# Patient Record
Sex: Male | Born: 1945 | Race: White | Hispanic: No | Marital: Single | Smoking: Never smoker
Health system: Southern US, Community
[De-identification: ages and names within clinical notes are randomized; demographics above are authoritative.]

---

## 2003-12-18 ENCOUNTER — Ambulatory Visit: Payer: Self-pay | Admitting: Internal Medicine

## 2009-12-10 ENCOUNTER — Ambulatory Visit: Payer: Self-pay | Admitting: Internal Medicine

## 2020-03-03 ENCOUNTER — Other Ambulatory Visit: Payer: Self-pay | Admitting: Nephrology

## 2020-03-03 DIAGNOSIS — R82998 Other abnormal findings in urine: Secondary | ICD-10-CM

## 2020-03-03 DIAGNOSIS — N1831 Chronic kidney disease, stage 3a: Secondary | ICD-10-CM

## 2020-03-15 ENCOUNTER — Other Ambulatory Visit: Payer: Self-pay

## 2020-03-15 ENCOUNTER — Ambulatory Visit
Admission: RE | Admit: 2020-03-15 | Discharge: 2020-03-15 | Disposition: A | Discharge status: Home / Self Care | Payer: Medicare Other | Source: Ambulatory Visit | Attending: Nephrology | Admitting: Nephrology

## 2020-03-15 DIAGNOSIS — N1831 Chronic kidney disease, stage 3a: Secondary | ICD-10-CM | POA: Insufficient documentation

## 2020-03-15 DIAGNOSIS — R82998 Other abnormal findings in urine: Secondary | ICD-10-CM | POA: Insufficient documentation

## 2021-04-20 IMAGING — US US RENAL
1 series · 14 of 25 positions shown · non-contrast
Comparison: 06/17/2003 report and prior.

CLINICAL DATA: Other abnormal findings in urine

EXAM:
RENAL / URINARY TRACT ULTRASOUND COMPLETE

[Series 1: us renal · 0.25mm/px · 14 of 62 slices shown]
[im 1/62]
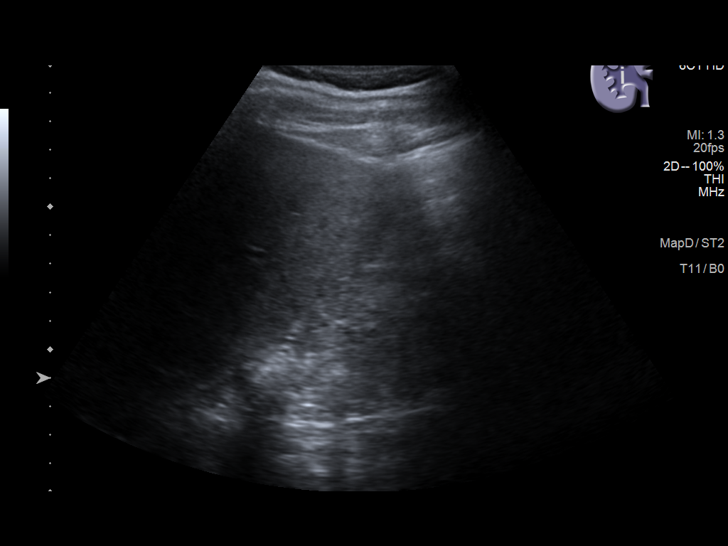
[im 6/62]
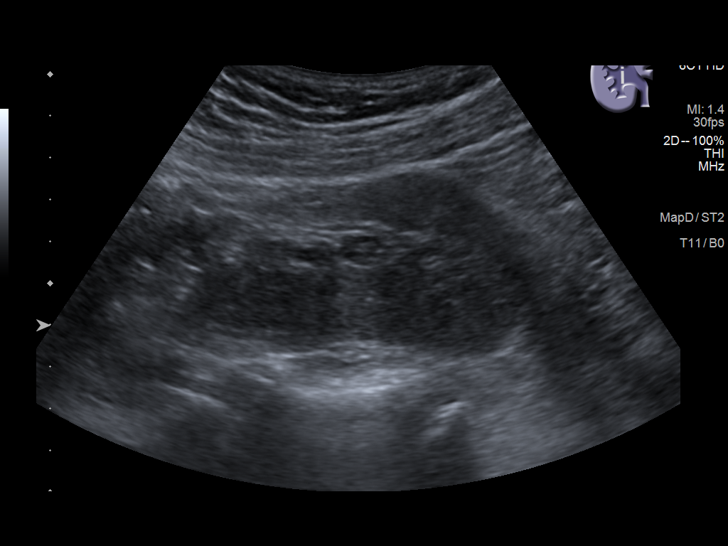
[im 11/62]
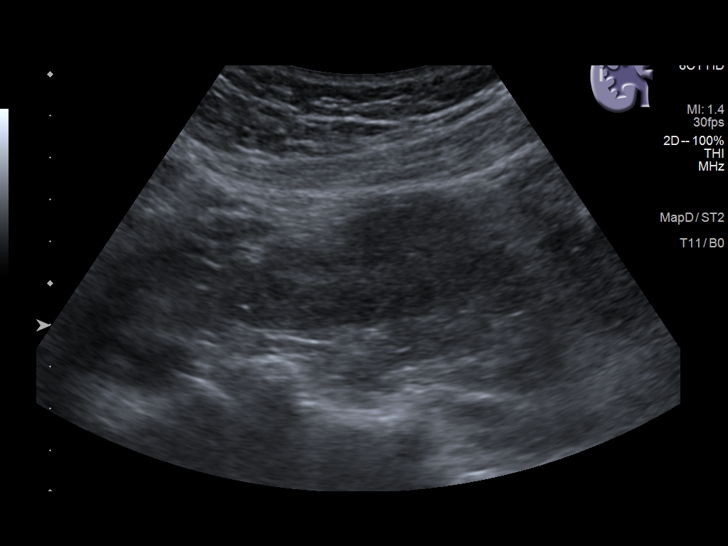
[im 16/62]
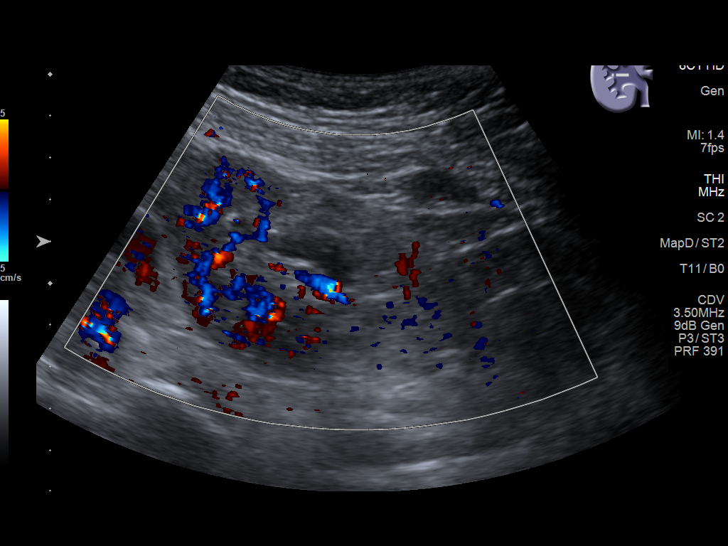
[im 21/62]
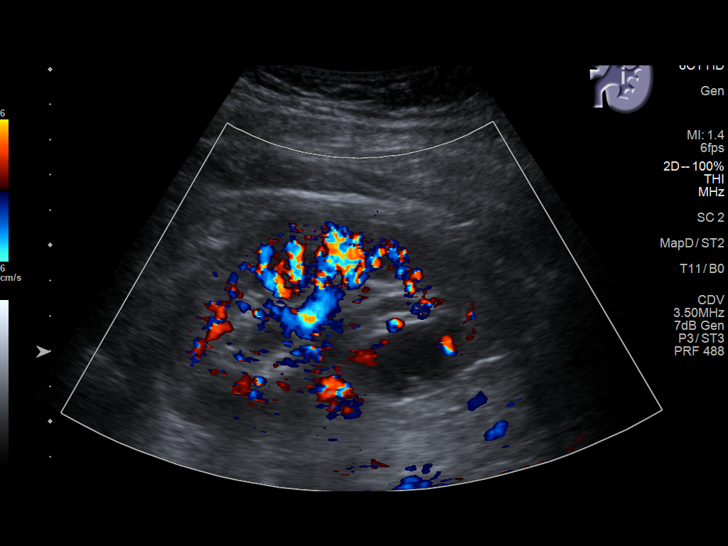
[im 23/62]
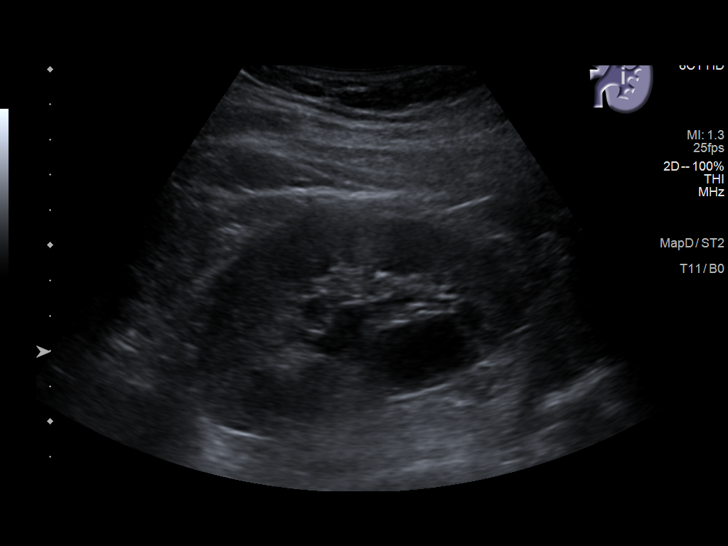
[im 28/62]
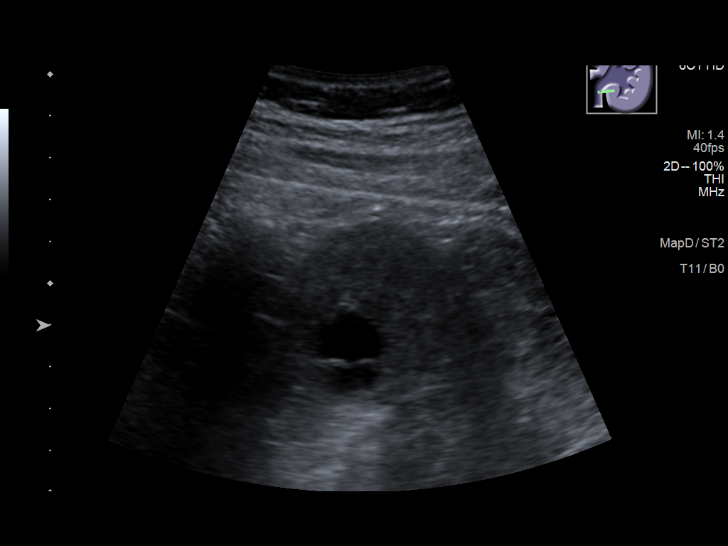
[im 34/62]
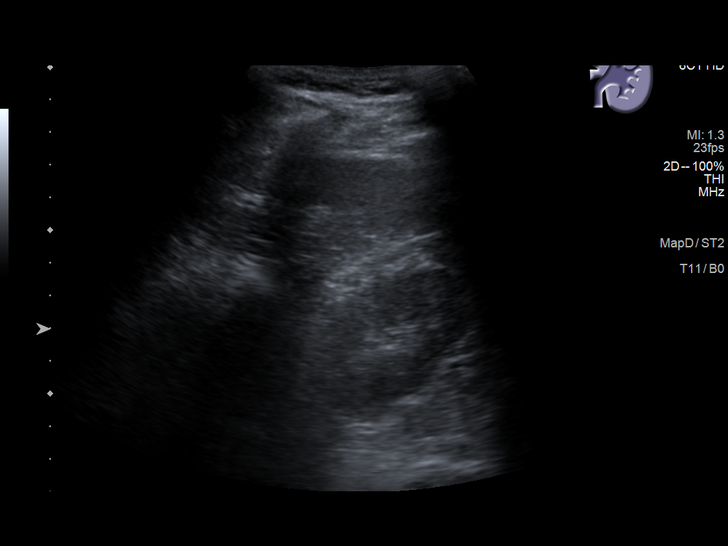
[im 39/62]
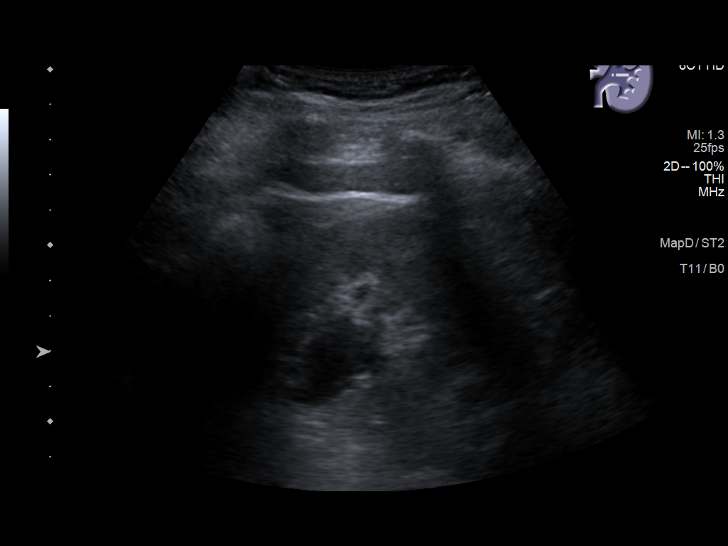
[im 41/62]
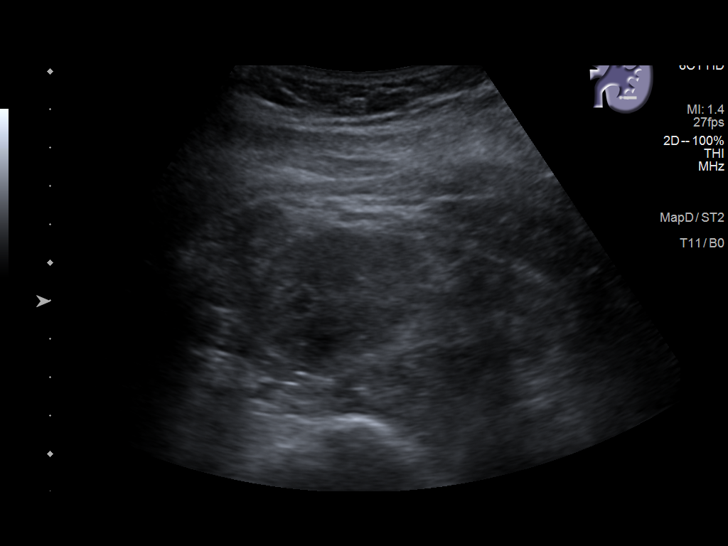
[im 46/62]
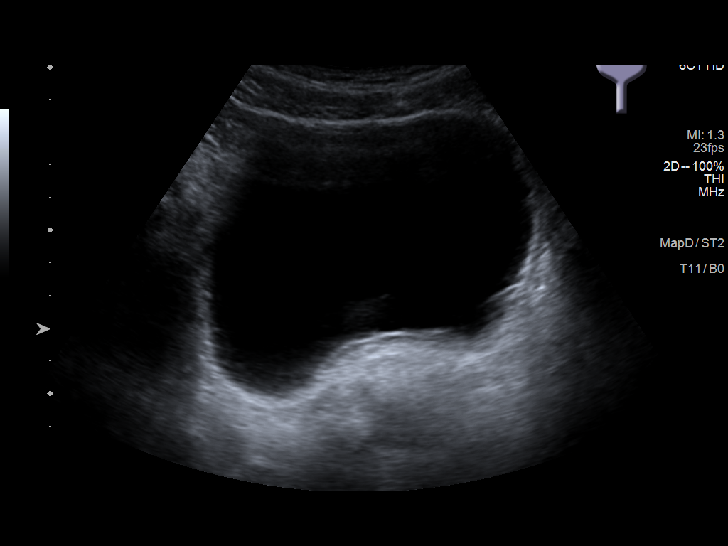
[im 51/62]
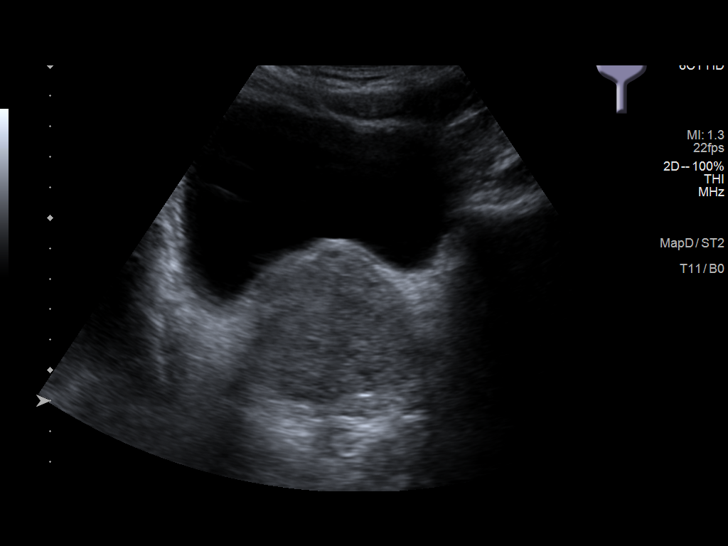
[im 56/62]
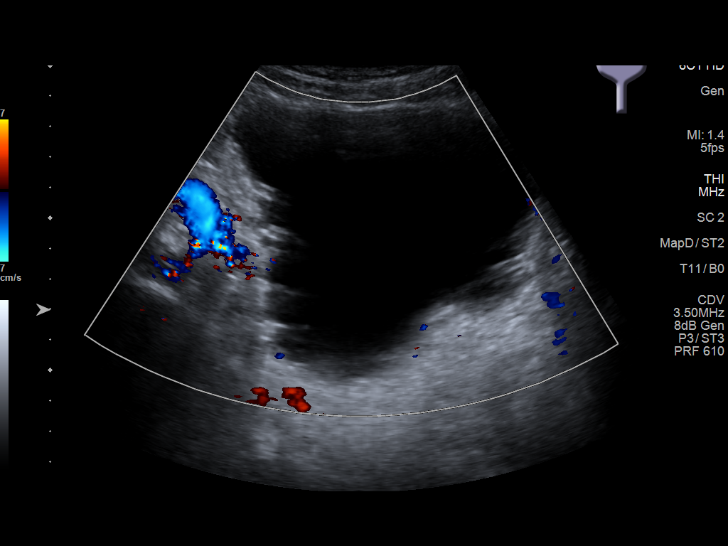
[im 62/62]
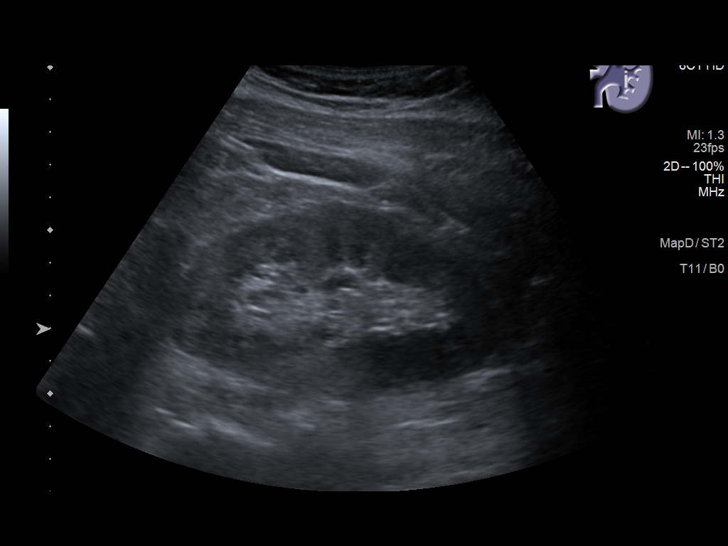

[14 of 25 positions shown; findings below may reference images not displayed]

FINDINGS: Right Kidney:

Renal measurements: 8.7 x 4.2 x 4.3 cm = volume: 82.8 mL. Pelvic
positioning. Echogenicity within normal limits. No mass or
hydronephrosis visualized.

Left Kidney:

Renal measurements: 9.5 x 4.7 x 4.9 cm = volume: 113.0 mL.
Echogenicity within normal limits. No hydronephrosis visualized.
Lower pole 2.7 x 2.0 x 2.0 cm complex cyst.

Bladder:

Appears normal for degree of bladder distention.

Other:

Prostate measures 4.6 x 5.7 x 5.3 cm with a volume of 73 mL.
IMPRESSION: Pelvic right kidney. 2.7 cm left inferior pole cyst.

Prostatomegaly.

## 2021-12-08 ENCOUNTER — Other Ambulatory Visit: Payer: Self-pay | Admitting: Orthopedic Surgery

## 2021-12-08 DIAGNOSIS — M4807 Spinal stenosis, lumbosacral region: Secondary | ICD-10-CM

## 2021-12-15 ENCOUNTER — Ambulatory Visit
Admission: RE | Admit: 2021-12-15 | Discharge: 2021-12-15 | Disposition: A | Discharge status: Home / Self Care | Payer: Medicare Other | Source: Ambulatory Visit | Attending: Orthopedic Surgery | Admitting: Orthopedic Surgery

## 2021-12-15 DIAGNOSIS — M4807 Spinal stenosis, lumbosacral region: Secondary | ICD-10-CM | POA: Diagnosis not present

## 2022-01-24 ENCOUNTER — Ambulatory Visit: Payer: Medicare Other | Admitting: Physical Therapy

## 2022-01-25 ENCOUNTER — Encounter: Payer: Self-pay | Admitting: Physical Therapy

## 2022-01-25 ENCOUNTER — Ambulatory Visit: Payer: Medicare Other | Attending: Family Medicine | Admitting: Physical Therapy

## 2022-01-25 DIAGNOSIS — M5459 Other low back pain: Secondary | ICD-10-CM | POA: Insufficient documentation

## 2022-01-25 NOTE — Therapy (Signed)
OUTPATIENT PHYSICAL THERAPY THORACOLUMBAR EVALUATION   Patient Name: Scott Ochoa MRN: [id number] DOB:[birth year], 76 y.o., male Today's Date: 01/25/2022   PT End of Session - 01/25/22 1145     Visit Number 1    Number of Visits 17    Date for PT Re-Evaluation 03/23/22    Authorization - Visit Number 1    Authorization - Number of Visits 10    Progress Note Due on Visit 10    PT Start Time 0915    PT Stop Time 1000    PT Time Calculation (min) 45 min    Activity Tolerance Patient tolerated treatment well    Behavior During Therapy [facility] for tasks assessed/performed             History reviewed. No pertinent past medical history. History reviewed. No pertinent surgical history. There are no problems to display for this patient.   PCP: Marcelino Duster MD  REFERRING PROVIDER: Burman Freestone, FNP  REFERRING DIAG: Lumbar stenosis  Rationale for Evaluation and Treatment: Rehabilitation  THERAPY DIAG:  Other low back pain - Plan: PT plan of care cert/re-cert  ONSET DATE: 2011 - new onset June 2023  SUBJECTIVE:                                                                                                                                                                                           SUBJECTIVE STATEMENT: Acute on chronic LBP  PERTINENT HISTORY:  Pt is a 76 year male presenting with chronic LBP over the past 10 years, with most recent episode beginning June of this year. Reports prior to this episode he had not had an episode of pain for 3 years, and that usually they only last a couple weeks. Insidious onset of pain. Pain is at the R side of the low back that radiates down posterior R leg with numbess and tingling to the toes. Reports pain is exacerbated by hitting golf balls, bending forward, walking >61miles, sitting/traveling >21mins, lifting something from the floor. Pt reports current pain 4/10, worst: 6/10, best: 2/10. Pt is retired, is the landlord of a couple  Manufacturing engineer, enjoys golfing and going to the gym 5-6x/week and is using strengthening machines with treadmill/elliptical for . Pt denies N/V, B&B changes, unexplained weight fluctuation, saddle paresthesia, fever, night sweats, or unrelenting night pain at this time.   PAIN:  Are you having pain? Yes: NPRS scale: 4/10 Pain location: R side of the low back radiation down RLE Pain description: sharp, n/t down RLE Aggravating factors: hitting golf balls, bending forward, walking >85miles, sitting/traveling >39mins, lifting something from the floor Relieving factors:  ice, chiropractic  PRECAUTIONS: None  WEIGHT BEARING RESTRICTIONS: No  FALLS:  Has patient fallen in last 6 months? No  LIVING ENVIRONMENT: Lives with: lives alone Lives in: House/apartment Stairs: No Has following equipment at home: None  OCCUPATION:    PLOF: Independent  PATIENT GOALS: Decrease pain to be able to play golf   NEXT MD VISIT:   OBJECTIVE:   DIAGNOSTIC FINDINGS:  Sept 2023 1. L2-3: Bulging of the disc. Facet and ligamentous hypertrophy.  Stenosis of both lateral recesses that could cause neural  compression. Findings have worsened slightly since 2011.  2. L3-4: Annular bulging with annular fissures. Facet and  ligamentous hypertrophy. Stenosis of both lateral recesses that  could cause neural compression. Findings have worsened since 2011.  3. L4-5: Annular bulging. Facet and ligamentous hypertrophy. Mild  stenosis of both lateral recesses, slightly worsened since 2011.   PATIENT SURVEYS:  FOTO goal of   SCREENING FOR RED FLAGS: Bowel or bladder incontinence: No Spinal tumors: No Cauda equina syndrome: No Compression fracture: No Abdominal aneurysm: No  COGNITION: Overall cognitive status: Within functional limits for tasks assessed     SENSATION: WFL  MUSCLE LENGTH: Hamstrings: shortened bilat approx 110d Thomas test: WNL bilat  POSTURE: rounded shoulders, forward  head, decreased lumbar lordosis, increased thoracic kyphosis, and weight shift left with R lateral trunk lean  PALPATION: Concordant hip pain to deep palpation of glute max over piriformis, reporting some radicular pain with this No TTP or pain to lumbar parspinal, QL or glute med/superior glute No change in pain with CPA/UPA mobilizations over lumbar segments, hypomobile throughout Hypomobility to mid thoracic spine as well  LUMBAR ROM:   AROM eval  Flexion 25% limited  Extension 25% limited w. pain  Right lateral flexion WNL with pain to side of the LB  Left lateral flexion wnl  Right rotation WNL  Left rotation WNL   (Blank rows = not tested)  LOWER EXTREMITY ROM:     Active  Right eval Left eval  Hip flexion WNL WNL  Hip extension 10% Limited bilat  Hip abduction WNL WNL  Hip adduction    Hip internal rotation 25% limited with hip pain bilat  Hip external rotation WNL WNL  Knee flexion WNL WNL  Knee extension WNL with tension/difficulty with true TKE  Ankle dorsiflexion WNL WNL  Ankle plantarflexion    Ankle inversion    Ankle eversion     (Blank rows = not tested)  LOWER EXTREMITY MMT:    MMT Right eval Left eval  Hip flexion 5 5  Hip extension 4 5  Hip abduction 4 5  Hip adduction    Hip internal rotation 5 5  Hip external rotation 5 5  Knee flexion    Knee extension    Ankle dorsiflexion    Ankle plantarflexion    Ankle inversion    Ankle eversion     (Blank rows = not tested)  LUMBAR SPECIAL TESTS:  Straight leg raise test: Negative, Slump test: Negative, Single leg stance test: R 20sec L ceased at , FABER test: Negative, Trendelenburg sign: Positive, and Thomas test: Negative  Repeated motion neg for lumbar forward flex and ext   FUNCTIONAL TESTS:  5 times sit to stand: 8sec 10 meter walk test: 1.59m/s SLS R 5/20sec L ceased at SL squat unable  GAIT: Distance walked: 19m Assistive device utilized: None Level of assistance:  Complete Independence Comments: R trendelenberg with decreased R shoulder height/lateral trunk lean throughout ambilation  TODAY'S TREATMENT:                                                                                                                              DATE: 01/25/22 Ther-Ex  PT reviewed the following HEP with patient with patient able to demonstrate a set of the following with min cuing for correction needed. PT educated patient on parameters of therex (how/when to inc/decrease intensity, frequency, rep/set range, stretch hold time, and purpose of therex) with verbalized understanding.   Access Code: [email or web address] - Seated Piriformis Stretch  - 1 x daily - 7 x weekly - 3 sets - 10 reps - Bridge with Hip Abduction and Resistance  - 4 x weekly - 3 sets - 12-20 reps - Single Leg Stance  - 1-2 x daily - 7 x weekly - 60sec hold  Pt with difficulty firing hip ext with initial SL bridge attempt and double leg bridge attempt unable to provoke hip ext activation for patient- good sensation with bridge with band  PATIENT EDUCATION:  Education details: Patient was educated on diagnosis, anatomy and pathology involved, prognosis, role of PT, and was given an HEP, demonstrating exercise with proper form following verbal and tactile cues, and was given a paper hand out to continue exercise at home. Pt was educated on and agreed to plan of care.  Person educated: Patient Education method: Explanation, Demonstration, Verbal cues, and Handouts Education comprehension: verbalized understanding, returned demonstration, verbal cues required, and tactile cues required  HOME EXERCISE PROGRAM: [email or web address]  ASSESSMENT:  CLINICAL IMPRESSION: Patient is a 76 y.o. male who was seen today for physical therapy evaluation and treatment for low back pain. Pt with signs and symptoms of lumbar stenosis > radiculopathy, with possible cause of radicular symptoms at level of piriformis. Impairments of decreased hip  ext and abd strength, decreased lumbar mobility, decreased hip IR bilat, gait abnormalities, posture abnormality, increased hamstring tension bilat, and pain. Activity limitations in functional squat, ambulation, prolonged sitting, listing, and forward bending; inhibiting full participation in functional ADLs and community activity, including golfing and his gym regimen. Would benefit from skilled PT to address above deficits and promote optimal return to PLOF.   OBJECTIVE IMPAIRMENTS: Abnormal gait, decreased activity tolerance, decreased balance, decreased coordination, decreased endurance, decreased mobility, difficulty walking, decreased ROM, decreased strength, increased fascial restrictions, impaired perceived functional ability, impaired flexibility, impaired tone, improper body mechanics, postural dysfunction, and pain.   ACTIVITY LIMITATIONS: carrying, lifting, bending, sitting, standing, squatting, transfers, and reach over head  PARTICIPATION LIMITATIONS: meal prep, cleaning, laundry, driving, community activity, and occupation  PERSONAL FACTORS: Age, Past/current experiences, Sex, and Time since onset of injury/illness/exacerbation are also affecting patient's functional outcome.   REHAB POTENTIAL: Good  CLINICAL DECISION MAKING: Evolving/moderate complexity  EVALUATION COMPLEXITY: Moderate   GOALS: Goals reviewed with patient? Yes  SHORT TERM GOALS: Target date: 02/22/2022  Pt will be independent with HEP in order to improve strength and mobility in order to  improve function at home and in the community.  Baseline: 01/25/22 HEP given  Goal status: INITIAL   LONG TERM GOALS: Target date: 03/22/2022  Pt will decrease worst pain as reported on NPRS by at least 3 points in order to demonstrate clinically significant reduction in pain.  Baseline: 01/25/22 6/10 Goal status: INITIAL  2.  Patient will increase FOTO score to 79 to demonstrate predicted increase in functional  mobility to complete ADLs  Baseline:  Goal status: INITIAL  3.  Patient will demonstrate 5/5 R hip MMTs to demonstrate PLOF strength of LLE in order to complete heavy ADLs Baseline: R hip ext and abd 4/5 MMT Goal status: INITIAL  4.  Pt will demonstrate R SLS of 30sec or more in order to demonstrate age matched static balance norms Baseline: R: 20sec L ceased at a minute Goal status: INITIAL   PLAN:  PT FREQUENCY: 1-2x/week  PT DURATION: 8 weeks  PLANNED INTERVENTIONS: Therapeutic exercises, Therapeutic activity, Neuromuscular re-education, Balance training, Gait training, Patient/Family education, Self Care, Joint mobilization, Joint manipulation, Stair training, Aquatic Therapy, Dry Needling, Electrical stimulation, Spinal manipulation, Spinal mobilization, Cryotherapy, Moist heat, Taping, Traction, and Ultrasound.  PLAN FOR NEXT SESSION: HEP review; lateral hip and core stability/strengthening   Hilda Lias, PT 01/25/2022, 1:06 PM

## 2022-01-29 ENCOUNTER — Encounter: Payer: Self-pay | Admitting: Physical Therapy

## 2022-01-29 ENCOUNTER — Ambulatory Visit: Payer: Medicare Other | Admitting: Physical Therapy

## 2022-01-29 DIAGNOSIS — M5459 Other low back pain: Secondary | ICD-10-CM | POA: Diagnosis not present

## 2022-01-29 NOTE — Therapy (Signed)
OUTPATIENT PHYSICAL THERAPY THORACOLUMBAR EVALUATION   Patient Name: Scott Ochoa MRN: [id number] DOB:11-Apr-1945, 76 y.o., male Today's Date: 01/29/2022   PT End of Session - 01/29/22 2423     Visit Number 2    Number of Visits 17    Date for PT Re-Evaluation 03/23/22    Authorization - Visit Number 2    Authorization - Number of Visits 10    Progress Note Due on Visit 10    PT Start Time 0918    PT Stop Time 0957    PT Time Calculation (min) 39 min    Activity Tolerance Patient tolerated treatment well    Behavior During Therapy [facility] for tasks assessed/performed              History reviewed. No pertinent past medical history. History reviewed. No pertinent surgical history. There are no problems to display for this patient.   PCP: Marcelino Duster MD  REFERRING PROVIDER: Burman Freestone, FNP  REFERRING DIAG: Lumbar stenosis  Rationale for Evaluation and Treatment: Rehabilitation  THERAPY DIAG:  Other low back pain  ONSET DATE: 2011 - new onset June 2023  SUBJECTIVE:                                                                                                                                                                                           SUBJECTIVE STATEMENT: Pt reports increased pain following being stressed this weekend. 4/10 pain currently in R side of low back. Reports his bridge with band targetted the muscles of his buttock.  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 Reports prior to this episode he had not had an episode of pain for 3 years, and that usually they only last a couple weeks. Insidious onset of pain. Pain is at the R side of the low back that radiates down posterior R leg with numbess and tingling to the toes. Reports pain is exacerbated by hitting golf balls, bending forward, walking >85miles, sitting/traveling >33mins, lifting  something from the floor. Pt reports current pain 4/10, worst: 6/10, best: 2/10. Pt is retired, is the landlord of a couple Manufacturing engineer, enjoys golfing and going to the gym 5-6x/week and is using strengthening machines with treadmill/elliptical for . Pt denies N/V, B&B changes, unexplained weight fluctuation, saddle paresthesia, fever, night sweats, or unrelenting night pain at this time.   PAIN:  Are you having pain? Yes: NPRS scale: 4/10 Pain location: R side of the low back radiation down RLE Pain description: sharp, n/t down  RLE Aggravating factors: hitting golf balls, bending forward, walking >8miles, sitting/traveling >6mins, lifting something from the floor Relieving factors: ice, chiropractic  PRECAUTIONS: None  WEIGHT BEARING RESTRICTIONS: No  FALLS:  Has patient fallen in last 6 months? No  LIVING ENVIRONMENT: Lives with: lives alone Lives in: House/apartment Stairs: No Has following equipment at home: None  OCCUPATION:    PLOF: Independent  PATIENT GOALS: Decrease pain to be able to play golf   NEXT MD VISIT:   OBJECTIVE:   DIAGNOSTIC FINDINGS:  Sept 2023 1. L2-3: Bulging of the disc. Facet and ligamentous hypertrophy.  Stenosis of both lateral recesses that could cause neural  compression. Findings have worsened slightly since 2011.  2. L3-4: Annular bulging with annular fissures. Facet and  ligamentous hypertrophy. Stenosis of both lateral recesses that  could cause neural compression. Findings have worsened since 2011.  3. L4-5: Annular bulging. Facet and ligamentous hypertrophy. Mild  stenosis of both lateral recesses, slightly worsened since 2011.   PATIENT SURVEYS:  FOTO goal of   SCREENING FOR RED FLAGS: Bowel or bladder incontinence: No Spinal tumors: No Cauda equina syndrome: No Compression fracture: No Abdominal aneurysm: No  COGNITION: Overall cognitive status: Within functional limits for tasks  assessed     SENSATION: WFL  MUSCLE LENGTH: Hamstrings: shortened bilat approx 110d Thomas test: WNL bilat  POSTURE: rounded shoulders, forward head, decreased lumbar lordosis, increased thoracic kyphosis, and weight shift left with R lateral trunk lean  PALPATION: Concordant hip pain to deep palpation of glute max over piriformis, reporting some radicular pain with this No TTP or pain to lumbar parspinal, QL or glute med/superior glute No change in pain with CPA/UPA mobilizations over lumbar segments, hypomobile throughout Hypomobility to mid thoracic spine as well  LUMBAR ROM:   AROM eval  Flexion 25% limited  Extension 25% limited w. pain  Right lateral flexion WNL with pain to side of the LB  Left lateral flexion wnl  Right rotation WNL  Left rotation WNL   (Blank rows = not tested)  LOWER EXTREMITY ROM:     Active  Right eval Left eval  Hip flexion WNL WNL  Hip extension 10% Limited bilat  Hip abduction WNL WNL  Hip adduction    Hip internal rotation 25% limited with hip pain bilat  Hip external rotation WNL WNL  Knee flexion WNL WNL  Knee extension WNL with tension/difficulty with true TKE  Ankle dorsiflexion WNL WNL  Ankle plantarflexion    Ankle inversion    Ankle eversion     (Blank rows = not tested)  LOWER EXTREMITY MMT:    MMT Right eval Left eval  Hip flexion 5 5  Hip extension 4 5  Hip abduction 4 5  Hip adduction    Hip internal rotation 5 5  Hip external rotation 5 5  Knee flexion    Knee extension    Ankle dorsiflexion    Ankle plantarflexion    Ankle inversion    Ankle eversion     (Blank rows = not tested)  LUMBAR SPECIAL TESTS:  Straight leg raise test: Negative, Slump test: Negative, Single leg stance test: R 20sec L ceased at , FABER test: Negative, Trendelenburg sign: Positive, and Thomas test: Negative  Repeated motion neg for lumbar forward flex and ext   FUNCTIONAL TESTS:  5 times sit to stand: 8sec 10 meter walk  test: 1.6m/s SLS R 5/20sec L ceased at SL squat unable  GAIT: Distance walked: 3m Assistive device  utilized: None Level of assistance: Complete Independence Comments: R trendelenberg with decreased R shoulder height/lateral trunk lean throughout ambilation  TODAY'S TREATMENT:                                                                                                                              DATE: 01/25/22 Ther-Ex Nustep Lower trunk rotation x20 with cuing needed to maintain sacrum contact for lumbopelvic dissociation  Open book x12 with cuing for set up with good carry over Banded bridge 2x 10 with glute set prior to set due to patient initially with hamstring activation/cramping  Hip hinge x12 with max VC/TC with success at end of reps Straight leg deadlift 10# DB 2x 10 with max cuing throughout for neutral spine and hip movement with decent carry over Plank with alternating hip ext 2x 12 with cuing for plank technique Piriformis stretch 30sec bilat  PATIENT EDUCATION:  Education details: Patient was educated on diagnosis, anatomy and pathology involved, prognosis, role of PT, and was given an HEP, demonstrating exercise with proper form following verbal and tactile cues, and was given a paper hand out to continue exercise at home. Pt was educated on and agreed to plan of care.  Person educated: Patient Education method: Explanation, Demonstration, Verbal cues, and Handouts Education comprehension: verbalized understanding, returned demonstration, verbal cues required, and tactile cues required  HOME EXERCISE PROGRAM: [email or web address]  ASSESSMENT:  CLINICAL IMPRESSION: PT initiated therex progression for increased lumbopelvic dissociation and core and hip strengthening/stability with success. Pt is able to comply with all cuing for proper technique of therex with good effort throughout session. Patient with VC, TC, and demonstrative cuing for success of therex. Would benefit  from skilled PT to address above deficits and promote optimal return to PLOF.    OBJECTIVE IMPAIRMENTS: Abnormal gait, decreased activity tolerance, decreased balance, decreased coordination, decreased endurance, decreased mobility, difficulty walking, decreased ROM, decreased strength, increased fascial restrictions, impaired perceived functional ability, impaired flexibility, impaired tone, improper body mechanics, postural dysfunction, and pain.   ACTIVITY LIMITATIONS: carrying, lifting, bending, sitting, standing, squatting, transfers, and reach over head  PARTICIPATION LIMITATIONS: meal prep, cleaning, laundry, driving, community activity, and occupation  PERSONAL FACTORS: Age, Past/current experiences, Sex, and Time since onset of injury/illness/exacerbation are also affecting patient's functional outcome.   REHAB POTENTIAL: Good  CLINICAL DECISION MAKING: Evolving/moderate complexity  EVALUATION COMPLEXITY: Moderate   GOALS: Goals reviewed with patient? Yes  SHORT TERM GOALS: Target date: 02/26/2022  Pt will be independent with HEP in order to improve strength and mobility in order to  improve function at home and in the community.  Baseline: 01/25/22 HEP given  Goal status: INITIAL   LONG TERM GOALS: Target date: 03/26/2022  Pt will decrease worst pain as reported on NPRS by at least 3 points in order to demonstrate clinically significant reduction in pain.  Baseline: 01/25/22 6/10 Goal status: INITIAL  2.  Patient will increase FOTO score to 79 to demonstrate  predicted increase in functional mobility to complete ADLs  Baseline:  Goal status: INITIAL  3.  Patient will demonstrate 5/5 R hip MMTs to demonstrate PLOF strength of LLE in order to complete heavy ADLs Baseline: R hip ext and abd 4/5 MMT Goal status: INITIAL  4.  Pt will demonstrate R SLS of 30sec or more in order to demonstrate age matched static balance norms Baseline: R: 20sec L ceased at a minute Goal  status: INITIAL   PLAN:  PT FREQUENCY: 1-2x/week  PT DURATION: 8 weeks  PLANNED INTERVENTIONS: Therapeutic exercises, Therapeutic activity, Neuromuscular re-education, Balance training, Gait training, Patient/Family education, Self Care, Joint mobilization, Joint manipulation, Stair training, Aquatic Therapy, Dry Needling, Electrical stimulation, Spinal manipulation, Spinal mobilization, Cryotherapy, Moist heat, Taping, Traction, and Ultrasound.  PLAN FOR NEXT SESSION: HEP review; lateral hip and core stability/strengthening  Hilda Lias DPT  Hilda Lias, PT 01/29/2022, 11:22 AM

## 2022-02-01 ENCOUNTER — Ambulatory Visit: Payer: Medicare Other | Admitting: Physical Therapy

## 2022-02-01 ENCOUNTER — Encounter: Payer: Self-pay | Admitting: Physical Therapy

## 2022-02-01 DIAGNOSIS — M5459 Other low back pain: Secondary | ICD-10-CM

## 2022-02-01 NOTE — Therapy (Signed)
OUTPATIENT PHYSICAL THERAPY THORACOLUMBAR EVALUATION   Patient Name: Scott Ochoa MRN: [id number] DOB:1945/07/28, 76 y.o., male Today's Date: 02/01/2022   PT End of Session - 02/01/22 0916     Visit Number 3    Number of Visits 17    Date for PT Re-Evaluation 03/23/22    Authorization - Visit Number 3    Authorization - Number of Visits 10    Progress Note Due on Visit 10    PT Start Time 0916    PT Stop Time 0958    PT Time Calculation (min) 42 min    Activity Tolerance Patient tolerated treatment well    Behavior During Therapy [facility] for tasks assessed/performed               History reviewed. No pertinent past medical history. History reviewed. No pertinent surgical history. There are no problems to display for this patient.   PCP: Marcelino Duster MD  REFERRING PROVIDER: Burman Freestone, FNP  REFERRING DIAG: Lumbar stenosis  Rationale for Evaluation and Treatment: Rehabilitation  THERAPY DIAG:  Other low back pain  ONSET DATE: 2011 - new onset June 2023  SUBJECTIVE:                                                                                                                                                                                           SUBJECTIVE STATEMENT: Pt reports feeling pretty good today. Reports that he can feel improvement and that his LBP is not occurring today. 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 Reports prior to this episode he had not had an episode of pain for 3 years, and that usually they only last a couple weeks. Insidious onset of pain. Pain is at the R side of the low back that radiates down posterior R leg with numbess and tingling to the toes. Reports pain is exacerbated by hitting golf balls, bending forward, walking >29miles, sitting/traveling >60mins, lifting something from the floor.  Pt reports current pain 4/10, worst: 6/10, best: 2/10. Pt is retired, is the landlord of a couple Manufacturing engineer, enjoys golfing and going to the gym 5-6x/week and is using strengthening machines with treadmill/elliptical for . Pt denies N/V, B&B changes, unexplained weight fluctuation, saddle paresthesia, fever, night sweats, or unrelenting night pain at this time.   PAIN:  Are you having pain? Yes: NPRS scale: 4/10 Pain location: R side of the low back radiation down RLE Pain description: sharp, n/t  down RLE Aggravating factors: hitting golf balls, bending forward, walking >682miles, sitting/traveling >1230mins, lifting something from the floor Relieving factors: ice, chiropractic  PRECAUTIONS: None  WEIGHT BEARING RESTRICTIONS: No  FALLS:  Has patient fallen in last 6 months? No  LIVING ENVIRONMENT: Lives with: lives alone Lives in: House/apartment Stairs: No Has following equipment at home: None  OCCUPATION:    PLOF: Independent  PATIENT GOALS: Decrease pain to be able to play golf   NEXT MD VISIT:   OBJECTIVE:   DIAGNOSTIC FINDINGS:  Sept 2023 1. L2-3: Bulging of the disc. Facet and ligamentous hypertrophy.  Stenosis of both lateral recesses that could cause neural  compression. Findings have worsened slightly since 2011.  2. L3-4: Annular bulging with annular fissures. Facet and  ligamentous hypertrophy. Stenosis of both lateral recesses that  could cause neural compression. Findings have worsened since 2011.  3. L4-5: Annular bulging. Facet and ligamentous hypertrophy. Mild  stenosis of both lateral recesses, slightly worsened since 2011.   PATIENT SURVEYS:  FOTO goal of   SCREENING FOR RED FLAGS: Bowel or bladder incontinence: No Spinal tumors: No Cauda equina syndrome: No Compression fracture: No Abdominal aneurysm: No  COGNITION: Overall cognitive status: Within functional limits for tasks assessed     SENSATION: WFL  MUSCLE  LENGTH: Hamstrings: shortened bilat approx 110d Thomas test: WNL bilat  POSTURE: rounded shoulders, forward head, decreased lumbar lordosis, increased thoracic kyphosis, and weight shift left with R lateral trunk lean  PALPATION: Concordant hip pain to deep palpation of glute max over piriformis, reporting some radicular pain with this No TTP or pain to lumbar parspinal, QL or glute med/superior glute No change in pain with CPA/UPA mobilizations over lumbar segments, hypomobile throughout Hypomobility to mid thoracic spine as well  LUMBAR ROM:   AROM eval  Flexion 25% limited  Extension 25% limited w. pain  Right lateral flexion WNL with pain to side of the LB  Left lateral flexion wnl  Right rotation WNL  Left rotation WNL   (Blank rows = not tested)  LOWER EXTREMITY ROM:     Active  Right eval Left eval  Hip flexion WNL WNL  Hip extension 10% Limited bilat  Hip abduction WNL WNL  Hip adduction    Hip internal rotation 25% limited with hip pain bilat  Hip external rotation WNL WNL  Knee flexion WNL WNL  Knee extension WNL with tension/difficulty with true TKE  Ankle dorsiflexion WNL WNL  Ankle plantarflexion    Ankle inversion    Ankle eversion     (Blank rows = not tested)  LOWER EXTREMITY MMT:    MMT Right eval Left eval  Hip flexion 5 5  Hip extension 4 5  Hip abduction 4 5  Hip adduction    Hip internal rotation 5 5  Hip external rotation 5 5  Knee flexion    Knee extension    Ankle dorsiflexion    Ankle plantarflexion    Ankle inversion    Ankle eversion     (Blank rows = not tested)  LUMBAR SPECIAL TESTS:  Straight leg raise test: Negative, Slump test: Negative, Single leg stance test: R 20sec L ceased at 1min, FABER test: Negative, Trendelenburg sign: Positive, and Thomas test: Negative  Repeated motion neg for lumbar forward flex and ext   FUNCTIONAL TESTS:  5 times sit to stand: 8sec 10 meter walk test: 1.4322m/s SLS R 5/20sec L ceased at  1min SL squat unable  GAIT: Distance walked: 5829m Assistive  device utilized: None Level of assistance: Complete Independence Comments: R trendelenberg with decreased R shoulder height/lateral trunk lean throughout ambilation  TODAY'S TREATMENT:                                                                                                                              DATE: 01/25/22 Ther-Ex Nustep seat 10 UE 14 L3 for gentle lumbar mobility and low level strengthening cuing for SPM in 70s range Open book x12 with cuing for set up with good carry over Banded bridge 2x 10 GTB with good carry over of initial cuing Post to ant pelvic tilt in hooklying x15 with difficulty  Cat/cow x15 with heacy TC and VC for proper technique with  Q-ped alt le lifts 2x 12 with max cuing needed for set LE cross body swings x15 each LE with cuing for lumbopelvic dissociation with some carry over Piriformis stretch 30sec bilat  PATIENT EDUCATION:  Education details: Patient was educated on diagnosis, anatomy and pathology involved, prognosis, role of PT, and was given an HEP, demonstrating exercise with proper form following verbal and tactile cues, and was given a paper hand out to continue exercise at home. Pt was educated on and agreed to plan of care.  Person educated: Patient Education method: Explanation, Demonstration, Verbal cues, and Handouts Education comprehension: verbalized understanding, returned demonstration, verbal cues required, and tactile cues required  HOME EXERCISE PROGRAM: ENFRPNXT  ASSESSMENT:  CLINICAL IMPRESSION: PT continued therex progression for increased lumbopelvic dissociation and core and hip strengthening/stability with success. Pt is able to comply with all cuing for proper technique of therex with good effort throughout session. Patient with VC, TC, and demonstrative cuing for success of lumbopelvic dissociation with most difficulty with this. Would benefit from skilled PT  to address above deficits and promote optimal return to PLOF.    OBJECTIVE IMPAIRMENTS: Abnormal gait, decreased activity tolerance, decreased balance, decreased coordination, decreased endurance, decreased mobility, difficulty walking, decreased ROM, decreased strength, increased fascial restrictions, impaired perceived functional ability, impaired flexibility, impaired tone, improper body mechanics, postural dysfunction, and pain.   ACTIVITY LIMITATIONS: carrying, lifting, bending, sitting, standing, squatting, transfers, and reach over head  PARTICIPATION LIMITATIONS: meal prep, cleaning, laundry, driving, community activity, and occupation  PERSONAL FACTORS: Age, Past/current experiences, Sex, and Time since onset of injury/illness/exacerbation are also affecting patient's functional outcome.   REHAB POTENTIAL: Good  CLINICAL DECISION MAKING: Evolving/moderate complexity  EVALUATION COMPLEXITY: Moderate   GOALS: Goals reviewed with patient? Yes  SHORT TERM GOALS: Target date: 03/01/2022  Pt will be independent with HEP in order to improve strength and mobility in order to  improve function at home and in the community.  Baseline: 01/25/22 HEP given  Goal status: INITIAL   LONG TERM GOALS: Target date: 03/29/2022  Pt will decrease worst pain as reported on NPRS by at least 3 points in order to demonstrate clinically significant reduction in pain.  Baseline: 01/25/22 6/10 Goal status: INITIAL  2.  Patient will increase FOTO score to 79 to demonstrate predicted increase in functional mobility to complete ADLs  Baseline:  Goal status: INITIAL  3.  Patient will demonstrate 5/5 R hip MMTs to demonstrate PLOF strength of LLE in order to complete heavy ADLs Baseline: R hip ext and abd 4/5 MMT Goal status: INITIAL  4.  Pt will demonstrate R SLS of 30sec or more in order to demonstrate age matched static balance norms Baseline: R: 20sec L ceased at a minute Goal status:  INITIAL   PLAN:  PT FREQUENCY: 1-2x/week  PT DURATION: 8 weeks  PLANNED INTERVENTIONS: Therapeutic exercises, Therapeutic activity, Neuromuscular re-education, Balance training, Gait training, Patient/Family education, Self Care, Joint mobilization, Joint manipulation, Stair training, Aquatic Therapy, Dry Needling, Electrical stimulation, Spinal manipulation, Spinal mobilization, Cryotherapy, Moist heat, Taping, Traction, and Ultrasound.  PLAN FOR NEXT SESSION: HEP review; lateral hip and core stability/strengthening  Hilda Lias DPT  Hilda Lias, PT 02/01/2022, 9:54 AM

## 2022-02-02 ENCOUNTER — Ambulatory Visit: Payer: Medicare Other | Admitting: Urology

## 2022-02-02 ENCOUNTER — Encounter: Payer: Self-pay | Admitting: Urology

## 2022-02-02 VITALS — BP 136/73 | HR 83 | Ht 71.0 in | Wt 185.0 lb

## 2022-02-02 DIAGNOSIS — N401 Enlarged prostate with lower urinary tract symptoms: Secondary | ICD-10-CM

## 2022-02-02 DIAGNOSIS — R972 Elevated prostate specific antigen [PSA]: Secondary | ICD-10-CM | POA: Diagnosis not present

## 2022-02-02 LAB — URINALYSIS, COMPLETE
Bilirubin, UA: NEGATIVE
Glucose, UA: NEGATIVE
Ketones, UA: NEGATIVE
Leukocytes,UA: NEGATIVE
Nitrite, UA: NEGATIVE
RBC, UA: NEGATIVE
Specific Gravity, UA: 1.015 (ref 1.005–1.030)
Urobilinogen, Ur: 0.2 mg/dL (ref 0.2–1.0)
pH, UA: 5.5 (ref 5.0–7.5)

## 2022-02-02 LAB — MICROSCOPIC EXAMINATION

## 2022-02-02 MED ORDER — TAMSULOSIN HCL 0.4 MG PO CAPS: 0.4000 mg | ORAL_CAPSULE | Freq: Every day | ORAL | 0 refills | Status: DC

## 2022-02-02 NOTE — Progress Notes (Unsigned)
02/02/2022 1:19 PM   [name] Oct 27, 1945 [id number]  Referring provider: Gracelyn Nurse, MD [address] [place],  [place] [address]  Chief Complaint  Patient presents with   Elevated PSA    HPI: Scott Ochoa is a 76 y.o. male referred for evaluation of an elevated PSA.  Mild PSA elevation dating back to 07/2015 Most recent PSA 6.94 on 01/23/2022 Mild lower urinary tract symptoms including frequency, urgency, hesitancy and postvoid dribbling Denies dysuria, gross hematuria or recurrent UTI Family history of prostate cancer States he had a prostate biopsy for an elevated PSA at the [place] ~ 10 years ago      PMH: GERD (gastroesophageal reflux disease)  Hiatal hernia  History of neutropenia  History of proteinuria syndrome  Hyperlipidemia  Sleep apnea   Surgical History: CHOLECYSTECTOMY 1994  ARTHROSCOPIC ROTATOR CUFF REPAIR 2004   Home Medications:  Allergies as of 02/02/2022   No Known Allergies      Medication List        Accurate as of February 02, 2022  1:19 PM. If you have any questions, ask your nurse or doctor.          STOP taking these medications    B-complex with vitamin C tablet Stopped by: Riki Altes, MD   Centrum Silver 50+Men Tabs Stopped by: Riki Altes, MD   cyanocobalamin 1000 MCG tablet Commonly known as: VITAMIN B12 Stopped by: Riki Altes, MD   sildenafil 100 MG tablet Commonly known as: VIAGRA Stopped by: Riki Altes, MD   tretinoin 0.025 % cream Commonly known as: RETIN-A Stopped by: Riki Altes, MD       TAKE these medications    traZODone 50 MG tablet Commonly known as: DESYREL TAKE ONE AND ONE-HALF TABLET BY MOUTH AT BEDTIME AS NEEDED FOR SLEEP        Allergies: No Known Allergies  Family History: History reviewed. No pertinent family history.  Social History:  reports that he has never smoked. He has never used smokeless tobacco. No history on file for alcohol use and  drug use.   Physical Exam: BP 136/73   Pulse 83   Ht 5\' 11"  (1.803 m)   Wt 185 lb (83.9 kg)   BMI 25.80 kg/m   Constitutional:  Alert and oriented, No acute distress. HEENT:  AT Respiratory: Normal respiratory effort, no increased work of breathing. GU: Prostate 80+ cc, smooth without nodules Skin: No rashes, bruises or suspicious lesions. Neurologic: Grossly intact, no focal deficits, moving all 4 extremities. Psychiatric: Normal mood and affect.  Laboratory Data:  Urinalysis Dipstick/microscopy negative   Assessment & Plan:    1. 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 Other potential causes including BPH and inflammation were discussed.  We discussed there is only a 20% chance of prostate cancer with a PSA of <10 with a benign DRE and only a ~ 5% chance of high-grade prostate cancer.  We discussed the only way to adequately diagnose prostate cancer would be a transrectal ultrasound and biopsy of the prostate. The procedure was discussed including potential risks of bleeding and infection/sepsis. He was also informed that a negative biopsy does not conclusively rule out the possibility that prostate cancer may be present and that continued monitoring is required. The use of multiparametric prostate MRI to evaluate for  abnormality suspicious for high-grade prostate cancer and aid in targeted biopsy was reviewed. Continued periodic surveillance was also discussed. He does have significant prostate size and lower urinary tract symptoms.  I have initially recommended a 30-day trial of tamsulosin with a repeat PSA in 1 month Records were reviewed and Care Everywhere ***  Riki Altes, MD  [facility] [address], [address] [place], [place] [address] [phone]

## 2022-02-03 ENCOUNTER — Encounter: Payer: Self-pay | Admitting: Urology

## 2022-02-06 ENCOUNTER — Ambulatory Visit: Payer: Medicare Other | Admitting: Physical Therapy

## 2022-02-06 ENCOUNTER — Encounter: Payer: Self-pay | Admitting: Physical Therapy

## 2022-02-06 DIAGNOSIS — M5459 Other low back pain: Secondary | ICD-10-CM

## 2022-02-06 NOTE — Therapy (Signed)
OUTPATIENT PHYSICAL THERAPY THORACOLUMBAR EVALUATION   Patient Name: Scott Ochoa MRN: [id number] DOB:1946-02-09, 76 y.o., male Today's Date: 02/07/2022   PT End of Session - 02/06/22 0835     Visit Number 4    Number of Visits 17    Date for PT Re-Evaluation 03/23/22    Authorization - Visit Number 4    Authorization - Number of Visits 10    Progress Note Due on Visit 10    PT Start Time 0833    PT Stop Time 0911    PT Time Calculation (min) 38 min    Activity Tolerance Patient tolerated treatment well    Behavior During Therapy [facility] for tasks assessed/performed                History reviewed. No pertinent past medical history. History reviewed. No pertinent surgical history. There are no problems to display for this patient.   PCP: Marcelino Duster MD  REFERRING PROVIDER: Burman Freestone, FNP  REFERRING DIAG: Lumbar stenosis  Rationale for Evaluation and Treatment: Rehabilitation  THERAPY DIAG:  Other low back pain  ONSET DATE: 2011 - new onset June 2023  SUBJECTIVE:                                                                                                                                                                                           SUBJECTIVE STATEMENT: Pt reports feeling pretty good today. Reports that he can feel improvement and that his LBP is not occurring today. 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 Reports prior to this episode he had not had an episode of pain for 3 years, and that usually they only last a couple weeks. Insidious onset of pain. Pain is at the R side of the low back that radiates down posterior R leg with numbess and tingling to the toes. Reports pain is exacerbated by hitting golf balls, bending forward, walking >38miles, sitting/traveling >53mins, lifting something from the floor.  Pt reports current pain 4/10, worst: 6/10, best: 2/10. Pt is retired, is the landlord of a couple Manufacturing engineer, enjoys golfing and going to the gym 5-6x/week and is using strengthening machines with treadmill/elliptical for . Pt denies N/V, B&B changes, unexplained weight fluctuation, saddle paresthesia, fever, night sweats, or unrelenting night pain at this time.   PAIN:  Are you having pain? Yes: NPRS scale: 4/10 Pain location: R side of the low back radiation down RLE Pain description: sharp,  n/t down RLE Aggravating factors: hitting golf balls, bending forward, walking >552miles, sitting/traveling >5130mins, lifting something from the floor Relieving factors: ice, chiropractic  PRECAUTIONS: None  WEIGHT BEARING RESTRICTIONS: No  FALLS:  Has patient fallen in last 6 months? No  LIVING ENVIRONMENT: Lives with: lives alone Lives in: House/apartment Stairs: No Has following equipment at home: None  OCCUPATION:    PLOF: Independent  PATIENT GOALS: Decrease pain to be able to play golf   NEXT MD VISIT:   OBJECTIVE:   DIAGNOSTIC FINDINGS:  Sept 2023 1. L2-3: Bulging of the disc. Facet and ligamentous hypertrophy.  Stenosis of both lateral recesses that could cause neural  compression. Findings have worsened slightly since 2011.  2. L3-4: Annular bulging with annular fissures. Facet and  ligamentous hypertrophy. Stenosis of both lateral recesses that  could cause neural compression. Findings have worsened since 2011.  3. L4-5: Annular bulging. Facet and ligamentous hypertrophy. Mild  stenosis of both lateral recesses, slightly worsened since 2011.   PATIENT SURVEYS:  FOTO goal of   SCREENING FOR RED FLAGS: Bowel or bladder incontinence: No Spinal tumors: No Cauda equina syndrome: No Compression fracture: No Abdominal aneurysm: No  COGNITION: Overall cognitive status: Within functional limits for tasks assessed     SENSATION: WFL  MUSCLE  LENGTH: Hamstrings: shortened bilat approx 110d Thomas test: WNL bilat  POSTURE: rounded shoulders, forward head, decreased lumbar lordosis, increased thoracic kyphosis, and weight shift left with R lateral trunk lean  PALPATION: Concordant hip pain to deep palpation of glute max over piriformis, reporting some radicular pain with this No TTP or pain to lumbar parspinal, QL or glute med/superior glute No change in pain with CPA/UPA mobilizations over lumbar segments, hypomobile throughout Hypomobility to mid thoracic spine as well  LUMBAR ROM:   AROM eval  Flexion 25% limited  Extension 25% limited w. pain  Right lateral flexion WNL with pain to side of the LB  Left lateral flexion wnl  Right rotation WNL  Left rotation WNL   (Blank rows = not tested)  LOWER EXTREMITY ROM:     Active  Right eval Left eval  Hip flexion WNL WNL  Hip extension 10% Limited bilat  Hip abduction WNL WNL  Hip adduction    Hip internal rotation 25% limited with hip pain bilat  Hip external rotation WNL WNL  Knee flexion WNL WNL  Knee extension WNL with tension/difficulty with true TKE  Ankle dorsiflexion WNL WNL  Ankle plantarflexion    Ankle inversion    Ankle eversion     (Blank rows = not tested)  LOWER EXTREMITY MMT:    MMT Right eval Left eval  Hip flexion 5 5  Hip extension 4 5  Hip abduction 4 5  Hip adduction    Hip internal rotation 5 5  Hip external rotation 5 5  Knee flexion    Knee extension    Ankle dorsiflexion    Ankle plantarflexion    Ankle inversion    Ankle eversion     (Blank rows = not tested)  LUMBAR SPECIAL TESTS:  Straight leg raise test: Negative, Slump test: Negative, Single leg stance test: R 20sec L ceased at 1min, FABER test: Negative, Trendelenburg sign: Positive, and Thomas test: Negative  Repeated motion neg for lumbar forward flex and ext   FUNCTIONAL TESTS:  5 times sit to stand: 8sec 10 meter walk test: 1.384m/s SLS R 5/20sec L ceased at  1min SL squat unable  GAIT: Distance walked: 4733m  Assistive device utilized: None Level of assistance: Complete Independence Comments: R trendelenberg with decreased R shoulder height/lateral trunk lean throughout ambilation  TODAY'S TREATMENT:                                                                                                                              DATE: 01/25/22 Ther-Ex Nustep seat 10 UE 14 L3 for gentle lumbar mobility and low level strengthening cuing for SPM in 70s range Lower trunk rotation x20  Ant <> post pelvic in hooklying with heavy cuing to prevent upper body movement Q-ped alt le lifts 2x 12 with cone on low back for TC with best carry over [place] split squat 2x 8 bilat with good carry over following demo and initial cuing LE cross body swings x15 each LE with cuing for lumbopelvic dissociation with some carry over Piriformis stretch 30sec bilat  PATIENT EDUCATION:  Education details: Patient was educated on diagnosis, anatomy and pathology involved, prognosis, role of PT, and was given an HEP, demonstrating exercise with proper form following verbal and tactile cues, and was given a paper hand out to continue exercise at home. Pt was educated on and agreed to plan of care.  Person educated: Patient Education method: Explanation, Demonstration, Verbal cues, and Handouts Education comprehension: verbalized understanding, returned demonstration, verbal cues required, and tactile cues required  HOME EXERCISE PROGRAM: ENFRPNXT  ASSESSMENT:  CLINICAL IMPRESSION: PT continued therex progression for increased lumbopelvic dissociation and core and hip strengthening/stability with success. Pt responds well to cuing for technique of therex with multimodal cuing. No increased pain throughout session. Would benefit from skilled PT to address above deficits and promote optimal return to PLOF.    OBJECTIVE IMPAIRMENTS: Abnormal gait, decreased activity  tolerance, decreased balance, decreased coordination, decreased endurance, decreased mobility, difficulty walking, decreased ROM, decreased strength, increased fascial restrictions, impaired perceived functional ability, impaired flexibility, impaired tone, improper body mechanics, postural dysfunction, and pain.   ACTIVITY LIMITATIONS: carrying, lifting, bending, sitting, standing, squatting, transfers, and reach over head  PARTICIPATION LIMITATIONS: meal prep, cleaning, laundry, driving, community activity, and occupation  PERSONAL FACTORS: Age, Past/current experiences, Sex, and Time since onset of injury/illness/exacerbation are also affecting patient's functional outcome.   REHAB POTENTIAL: Good  CLINICAL DECISION MAKING: Evolving/moderate complexity  EVALUATION COMPLEXITY: Moderate   GOALS: Goals reviewed with patient? Yes  SHORT TERM GOALS: Target date: 03/07/2022  Pt will be independent with HEP in order to improve strength and mobility in order to  improve function at home and in the community.  Baseline: 01/25/22 HEP given  Goal status: INITIAL   LONG TERM GOALS: Target date: 04/04/2022  Pt will decrease worst pain as reported on NPRS by at least 3 points in order to demonstrate clinically significant reduction in pain.  Baseline: 01/25/22 6/10 Goal status: INITIAL  2.  Patient will increase FOTO score to 79 to demonstrate predicted increase in functional mobility to complete ADLs  Baseline:  Goal status: INITIAL  3.  Patient will demonstrate 5/5 R hip MMTs to demonstrate PLOF strength of LLE in order to complete heavy ADLs Baseline: R hip ext and abd 4/5 MMT Goal status: INITIAL  4.  Pt will demonstrate R SLS of 30sec or more in order to demonstrate age matched static balance norms Baseline: R: 20sec L ceased at a minute Goal status: INITIAL   PLAN:  PT FREQUENCY: 1-2x/week  PT DURATION: 8 weeks  PLANNED INTERVENTIONS: Therapeutic exercises, Therapeutic  activity, Neuromuscular re-education, Balance training, Gait training, Patient/Family education, Self Care, Joint mobilization, Joint manipulation, Stair training, Aquatic Therapy, Dry Needling, Electrical stimulation, Spinal manipulation, Spinal mobilization, Cryotherapy, Moist heat, Taping, Traction, and Ultrasound.  PLAN FOR NEXT SESSION: HEP review; lateral hip and core stability/strengthening  Hilda Lias DPT  Hilda Lias, PT 02/07/2022, 10:48 AM

## 2022-02-07 ENCOUNTER — Encounter: Payer: Self-pay | Admitting: Physical Therapy

## 2022-02-13 ENCOUNTER — Encounter: Payer: Self-pay | Admitting: Physical Therapy

## 2022-02-13 ENCOUNTER — Ambulatory Visit: Payer: Medicare Other | Admitting: Physical Therapy

## 2022-02-13 DIAGNOSIS — M5459 Other low back pain: Secondary | ICD-10-CM | POA: Diagnosis not present

## 2022-02-13 NOTE — Therapy (Signed)
OUTPATIENT PHYSICAL THERAPY THORACOLUMBAR EVALUATION   Patient Name: Scott Ochoa MRN: [id number] DOB:03-08-1946, 76 y.o., male Today's Date: 02/13/2022   PT End of Session - 02/13/22 0921     Visit Number 5    Number of Visits 17    Date for PT Re-Evaluation 03/23/22    Authorization - Visit Number 5    Authorization - Number of Visits 10    Progress Note Due on Visit 10    PT Start Time 0920    PT Stop Time 1000    PT Time Calculation (min) 40 min    Activity Tolerance Patient tolerated treatment well    Behavior During Therapy [facility] for tasks assessed/performed                 History reviewed. No pertinent past medical history. History reviewed. No pertinent surgical history. There are no problems to display for this patient.   PCP: Harrel Lemon MD  REFERRING PROVIDER: Allene Dillon, FNP  REFERRING DIAG: Lumbar stenosis  Rationale for Evaluation and Treatment: Rehabilitation  THERAPY DIAG:  Other low back pain  ONSET DATE: 2011 - new onset June 2023  SUBJECTIVE:                                                                                                                                                                                           SUBJECTIVE STATEMENT: Pt reports 2/10 pain this am in bilat hips, that yesterday he had a sharp pain in the R hip that came on quickly and dissipated quickly. 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 Reports prior to this episode he had not had an episode of pain for 3 years, and that usually they only last a couple weeks. Insidious onset of pain. Pain is at the R side of the low back that radiates down posterior R leg with numbess and tingling to the toes. Reports pain is exacerbated by hitting golf balls, bending forward, walking >61miles, sitting/traveling >55mins, lifting something from the  floor. Pt reports current pain 4/10, worst: 6/10, best: 2/10. Pt is retired, is the landlord of a couple Engineer, building services, enjoys golfing and going to the gym 5-6x/week and is using strengthening machines with treadmill/elliptical for 16mins. Pt denies N/V, B&B changes, unexplained weight fluctuation, saddle paresthesia, fever, night sweats, or unrelenting night pain at this time.   PAIN:  Are you having pain? Yes: NPRS scale: 4/10 Pain location: R side of the low back radiation down RLE Pain description:  sharp, n/t down RLE Aggravating factors: hitting golf balls, bending forward, walking >70miles, sitting/traveling >20mins, lifting something from the floor Relieving factors: ice, chiropractic  PRECAUTIONS: None  WEIGHT BEARING RESTRICTIONS: No  FALLS:  Has patient fallen in last 6 months? No  LIVING ENVIRONMENT: Lives with: lives alone Lives in: House/apartment Stairs: No Has following equipment at home: None  OCCUPATION:    PLOF: Independent  PATIENT GOALS: Decrease pain to be able to play golf   NEXT MD VISIT:   OBJECTIVE:   DIAGNOSTIC FINDINGS:  Sept 2023 1. L2-3: Bulging of the disc. Facet and ligamentous hypertrophy.  Stenosis of both lateral recesses that could cause neural  compression. Findings have worsened slightly since 2011.  2. L3-4: Annular bulging with annular fissures. Facet and  ligamentous hypertrophy. Stenosis of both lateral recesses that  could cause neural compression. Findings have worsened since 2011.  3. L4-5: Annular bulging. Facet and ligamentous hypertrophy. Mild  stenosis of both lateral recesses, slightly worsened since 2011.   PATIENT SURVEYS:  FOTO goal of   SCREENING FOR RED FLAGS: Bowel or bladder incontinence: No Spinal tumors: No Cauda equina syndrome: No Compression fracture: No Abdominal aneurysm: No  COGNITION: Overall cognitive status: Within functional limits for tasks assessed     SENSATION: WFL  MUSCLE  LENGTH: Hamstrings: shortened bilat approx 110d Thomas test: WNL bilat  POSTURE: rounded shoulders, forward head, decreased lumbar lordosis, increased thoracic kyphosis, and weight shift left with R lateral trunk lean  PALPATION: Concordant hip pain to deep palpation of glute max over piriformis, reporting some radicular pain with this No TTP or pain to lumbar parspinal, QL or glute med/superior glute No change in pain with CPA/UPA mobilizations over lumbar segments, hypomobile throughout Hypomobility to mid thoracic spine as well  LUMBAR ROM:   AROM eval  Flexion 25% limited  Extension 25% limited w. pain  Right lateral flexion WNL with pain to side of the LB  Left lateral flexion wnl  Right rotation WNL  Left rotation WNL   (Blank rows = not tested)  LOWER EXTREMITY ROM:     Active  Right eval Left eval  Hip flexion WNL WNL  Hip extension 10% Limited bilat  Hip abduction WNL WNL  Hip adduction    Hip internal rotation 25% limited with hip pain bilat  Hip external rotation WNL WNL  Knee flexion WNL WNL  Knee extension WNL with tension/difficulty with true TKE  Ankle dorsiflexion WNL WNL  Ankle plantarflexion    Ankle inversion    Ankle eversion     (Blank rows = not tested)  LOWER EXTREMITY MMT:    MMT Right eval Left eval  Hip flexion 5 5  Hip extension 4 5  Hip abduction 4 5  Hip adduction    Hip internal rotation 5 5  Hip external rotation 5 5  Knee flexion    Knee extension    Ankle dorsiflexion    Ankle plantarflexion    Ankle inversion    Ankle eversion     (Blank rows = not tested)  LUMBAR SPECIAL TESTS:  Straight leg raise test: Negative, Slump test: Negative, Single leg stance test: R 20sec L ceased at 84min, FABER test: Negative, Trendelenburg sign: Positive, and Thomas test: Negative  Repeated motion neg for lumbar forward flex and ext   FUNCTIONAL TESTS:  5 times sit to stand: 8sec 10 meter walk test: 1.63m/s SLS R 5/20sec L ceased at  5min SL squat unable  GAIT: Distance walked:  67m Assistive device utilized: None Level of assistance: Complete Independence Comments: R trendelenberg with decreased R shoulder height/lateral trunk lean throughout ambilation  TODAY'S TREATMENT:                                                                                                                              DATE: 01/25/22 Ther-Ex Nustep seat 10 UE 14 L3 for gentle lumbar mobility and low level strengthening cuing for SPM in 70s range Cat/Cow x15 with max cuing for technique with decent carry over BirdDog 2x 12 with max cuing for coordination with decent carry over Bridge w/ alt march x12; from bosu (hardside) x12 with good carry over of initial cuing Goodmornings with overhead dowel for increased lumbothoracic ext x12; limited mobility evident LE cross body swings x15 each LE with cuing for lumbopelvic dissociation with some carry over Piriformis stretch 30sec bilat  PATIENT EDUCATION:  Education details: Patient was educated on diagnosis, anatomy and pathology involved, prognosis, role of PT, and was given an HEP, demonstrating exercise with proper form following verbal and tactile cues, and was given a paper hand out to continue exercise at home. Pt was educated on and agreed to plan of care.  Person educated: Patient Education method: Explanation, Demonstration, Verbal cues, and Handouts Education comprehension: verbalized understanding, returned demonstration, verbal cues required, and tactile cues required  HOME EXERCISE PROGRAM: ENFRPNXT  ASSESSMENT:  CLINICAL IMPRESSION: PT continued therex progression for increased lumbopelvic dissociation and core and hip strengthening/stability with success. Pt responds well to cuing for technique of therex with multimodal cuing. No increased pain throughout session. Would benefit from skilled PT to address above deficits and promote optimal return to PLOF.    OBJECTIVE  IMPAIRMENTS: Abnormal gait, decreased activity tolerance, decreased balance, decreased coordination, decreased endurance, decreased mobility, difficulty walking, decreased ROM, decreased strength, increased fascial restrictions, impaired perceived functional ability, impaired flexibility, impaired tone, improper body mechanics, postural dysfunction, and pain.   ACTIVITY LIMITATIONS: carrying, lifting, bending, sitting, standing, squatting, transfers, and reach over head  PARTICIPATION LIMITATIONS: meal prep, cleaning, laundry, driving, community activity, and occupation  PERSONAL FACTORS: Age, Past/current experiences, Sex, and Time since onset of injury/illness/exacerbation are also affecting patient's functional outcome.   REHAB POTENTIAL: Good  CLINICAL DECISION MAKING: Evolving/moderate complexity  EVALUATION COMPLEXITY: Moderate   GOALS: Goals reviewed with patient? Yes  SHORT TERM GOALS: Target date: 03/13/2022  Pt will be independent with HEP in order to improve strength and mobility in order to  improve function at home and in the community.  Baseline: 01/25/22 HEP given  Goal status: INITIAL   LONG TERM GOALS: Target date: 04/10/2022  Pt will decrease worst pain as reported on NPRS by at least 3 points in order to demonstrate clinically significant reduction in pain.  Baseline: 01/25/22 6/10 Goal status: INITIAL  2.  Patient will increase FOTO score to 79 to demonstrate predicted increase in functional mobility to complete ADLs  Baseline:  Goal status:  INITIAL  3.  Patient will demonstrate 5/5 R hip MMTs to demonstrate PLOF strength of LLE in order to complete heavy ADLs Baseline: R hip ext and abd 4/5 MMT Goal status: INITIAL  4.  Pt will demonstrate R SLS of 30sec or more in order to demonstrate age matched static balance norms Baseline: R: 20sec L ceased at a minute Goal status: INITIAL   PLAN:  PT FREQUENCY: 1-2x/week  PT DURATION: 8 weeks  PLANNED  INTERVENTIONS: Therapeutic exercises, Therapeutic activity, Neuromuscular re-education, Balance training, Gait training, Patient/Family education, Self Care, Joint mobilization, Joint manipulation, Stair training, Aquatic Therapy, Dry Needling, Electrical stimulation, Spinal manipulation, Spinal mobilization, Cryotherapy, Moist heat, Taping, Traction, and Ultrasound.  PLAN FOR NEXT SESSION: HEP review; lateral hip and core stability/strengthening  Durwin Reges DPT  Durwin Reges, PT 02/13/2022, 10:02 AM

## 2022-02-15 ENCOUNTER — Ambulatory Visit: Payer: Medicare Other | Admitting: Physical Therapy

## 2022-02-15 ENCOUNTER — Encounter: Payer: Self-pay | Admitting: Physical Therapy

## 2022-02-15 DIAGNOSIS — M5459 Other low back pain: Secondary | ICD-10-CM

## 2022-02-15 NOTE — Therapy (Signed)
OUTPATIENT PHYSICAL THERAPY THORACOLUMBAR EVALUATION   Patient Name: MAXTEN SHULER MRN: [id number] DOB:07/28/1945, 76 y.o., male Today's Date: 02/15/2022   PT End of Session - 02/15/22 1006     Visit Number 6    Number of Visits 17    Date for PT Re-Evaluation 03/23/22    Authorization - Visit Number 6    Authorization - Number of Visits 10    Progress Note Due on Visit 10    PT Start Time 1004    PT Stop Time 1045    PT Time Calculation (min) 41 min    Activity Tolerance Patient tolerated treatment well    Behavior During Therapy [facility] for tasks assessed/performed                  History reviewed. No pertinent past medical history. History reviewed. No pertinent surgical history. There are no problems to display for this patient.   PCP: Marcelino Duster MD  REFERRING PROVIDER: Burman Freestone, FNP  REFERRING DIAG: Lumbar stenosis  Rationale for Evaluation and Treatment: Rehabilitation  THERAPY DIAG:  Other low back pain  ONSET DATE: 2011 - new onset June 2023  SUBJECTIVE:                                                                                                                                                                                           SUBJECTIVE STATEMENT: Pt reports feeling pretty good this am. He reports occassional pain down his legs, but thinks this is getting better overall. 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 Reports prior to this episode he had not had an episode of pain for 3 years, and that usually they only last a couple weeks. Insidious onset of pain. Pain is at the R side of the low back that radiates down posterior R leg with numbess and tingling to the toes. Reports pain is exacerbated by hitting golf balls, bending forward, walking >58miles, sitting/traveling >72mins, lifting something from the floor.  Pt reports current pain 4/10, worst: 6/10, best: 2/10. Pt is retired, is the landlord of a couple Manufacturing engineer, enjoys golfing and going to the gym 5-6x/week and is using strengthening machines with treadmill/elliptical for . Pt denies N/V, B&B changes, unexplained weight fluctuation, saddle paresthesia, fever, night sweats, or unrelenting night pain at this time.   PAIN:  Are you having pain? Yes: NPRS scale: 4/10 Pain location: R side of the low back radiation down RLE Pain description: sharp, n/t down RLE  Aggravating factors: hitting golf balls, bending forward, walking >49miles, sitting/traveling >14mins, lifting something from the floor Relieving factors: ice, chiropractic  PRECAUTIONS: None  WEIGHT BEARING RESTRICTIONS: No  FALLS:  Has patient fallen in last 6 months? No  LIVING ENVIRONMENT: Lives with: lives alone Lives in: House/apartment Stairs: No Has following equipment at home: None  OCCUPATION:    PLOF: Independent  PATIENT GOALS: Decrease pain to be able to play golf   NEXT MD VISIT:   OBJECTIVE:   DIAGNOSTIC FINDINGS:  Sept 2023 1. L2-3: Bulging of the disc. Facet and ligamentous hypertrophy.  Stenosis of both lateral recesses that could cause neural  compression. Findings have worsened slightly since 2011.  2. L3-4: Annular bulging with annular fissures. Facet and  ligamentous hypertrophy. Stenosis of both lateral recesses that  could cause neural compression. Findings have worsened since 2011.  3. L4-5: Annular bulging. Facet and ligamentous hypertrophy. Mild  stenosis of both lateral recesses, slightly worsened since 2011.   PATIENT SURVEYS:  FOTO goal of   SCREENING FOR RED FLAGS: Bowel or bladder incontinence: No Spinal tumors: No Cauda equina syndrome: No Compression fracture: No Abdominal aneurysm: No  COGNITION: Overall cognitive status: Within functional limits for tasks assessed     SENSATION: WFL  MUSCLE  LENGTH: Hamstrings: shortened bilat approx 110d Thomas test: WNL bilat  POSTURE: rounded shoulders, forward head, decreased lumbar lordosis, increased thoracic kyphosis, and weight shift left with R lateral trunk lean  PALPATION: Concordant hip pain to deep palpation of glute max over piriformis, reporting some radicular pain with this No TTP or pain to lumbar parspinal, QL or glute med/superior glute No change in pain with CPA/UPA mobilizations over lumbar segments, hypomobile throughout Hypomobility to mid thoracic spine as well  LUMBAR ROM:   AROM eval  Flexion 25% limited  Extension 25% limited w. pain  Right lateral flexion WNL with pain to side of the LB  Left lateral flexion wnl  Right rotation WNL  Left rotation WNL   (Blank rows = not tested)  LOWER EXTREMITY ROM:     Active  Right eval Left eval  Hip flexion WNL WNL  Hip extension 10% Limited bilat  Hip abduction WNL WNL  Hip adduction    Hip internal rotation 25% limited with hip pain bilat  Hip external rotation WNL WNL  Knee flexion WNL WNL  Knee extension WNL with tension/difficulty with true TKE  Ankle dorsiflexion WNL WNL  Ankle plantarflexion    Ankle inversion    Ankle eversion     (Blank rows = not tested)  LOWER EXTREMITY MMT:    MMT Right eval Left eval  Hip flexion 5 5  Hip extension 4 5  Hip abduction 4 5  Hip adduction    Hip internal rotation 5 5  Hip external rotation 5 5  Knee flexion    Knee extension    Ankle dorsiflexion    Ankle plantarflexion    Ankle inversion    Ankle eversion     (Blank rows = not tested)  LUMBAR SPECIAL TESTS:  Straight leg raise test: Negative, Slump test: Negative, Single leg stance test: R 20sec L ceased at , FABER test: Negative, Trendelenburg sign: Positive, and Thomas test: Negative  Repeated motion neg for lumbar forward flex and ext   FUNCTIONAL TESTS:  5 times sit to stand: 8sec 10 meter walk test: 1.38m/s SLS R 5/20sec L ceased at  SL squat unable  GAIT: Distance walked: 23m Assistive device utilized:  None Level of assistance: Complete Independence Comments: R trendelenberg with decreased R shoulder height/lateral trunk lean throughout ambilation  TODAY'S TREATMENT:                                                                                                                               Ther-Ex Nustep seat 10 UE 14 L3 for gentle lumbar mobility and low level strengthening cuing for SPM in 70s range Figure 4 stretch- unable Hooklying 30sec bilat increased flexibility L>R Prone press up x12; unilateral prone press with rotation x12 bilat with cuing to prevent concordant hip rotation BirdDog 2x 12 with max cuing for coordination with decent carry over Half kneeling anti-rotation GTB 2x 12 bilat with cuing for posture and eccentric control with good carry over Goodmornings with overhead dowel for increased lumbothoracic ext x12; limited mobility evident LE cross body swings x15 each LE with cuing for lumbopelvic dissociation with some carry over   PATIENT EDUCATION:  Education details: Patient was educated on diagnosis, anatomy and pathology involved, prognosis, role of PT, and was given an HEP, demonstrating exercise with proper form following verbal and tactile cues, and was given a paper hand out to continue exercise at home. Pt was educated on and agreed to plan of care.  Person educated: Patient Education method: Explanation, Demonstration, Verbal cues, and Handouts Education comprehension: verbalized understanding, returned demonstration, verbal cues required, and tactile cues required  HOME EXERCISE PROGRAM: ENFRPNXT  ASSESSMENT:  CLINICAL IMPRESSION: PT continued therex progression for increased lumbopelvic dissociation and hip and core strength with success. Patient is able to comply with all cuing for proper technique of therex with multimodal cuing utilized. Patient with good effort throughout  session and no increased pain. Would benefit from skilled PT to address above deficits and promote optimal return to PLOF.    OBJECTIVE IMPAIRMENTS: Abnormal gait, decreased activity tolerance, decreased balance, decreased coordination, decreased endurance, decreased mobility, difficulty walking, decreased ROM, decreased strength, increased fascial restrictions, impaired perceived functional ability, impaired flexibility, impaired tone, improper body mechanics, postural dysfunction, and pain.   ACTIVITY LIMITATIONS: carrying, lifting, bending, sitting, standing, squatting, transfers, and reach over head  PARTICIPATION LIMITATIONS: meal prep, cleaning, laundry, driving, community activity, and occupation  PERSONAL FACTORS: Age, Past/current experiences, Sex, and Time since onset of injury/illness/exacerbation are also affecting patient's functional outcome.   REHAB POTENTIAL: Good  CLINICAL DECISION MAKING: Evolving/moderate complexity  EVALUATION COMPLEXITY: Moderate   GOALS: Goals reviewed with patient? Yes  SHORT TERM GOALS: Target date: 03/15/2022  Pt will be independent with HEP in order to improve strength and mobility in order to  improve function at home and in the community.  Baseline: 01/25/22 HEP given  Goal status: INITIAL   LONG TERM GOALS: Target date: 04/12/2022  Pt will decrease worst pain as reported on NPRS by at least 3 points in order to demonstrate clinically significant reduction in pain.  Baseline: 01/25/22 6/10 Goal status: INITIAL  2.  Patient  will increase FOTO score to 79 to demonstrate predicted increase in functional mobility to complete ADLs  Baseline:  Goal status: INITIAL  3.  Patient will demonstrate 5/5 R hip MMTs to demonstrate PLOF strength of LLE in order to complete heavy ADLs Baseline: R hip ext and abd 4/5 MMT Goal status: INITIAL  4.  Pt will demonstrate R SLS of 30sec or more in order to demonstrate age matched static balance  norms Baseline: R: 20sec L ceased at a minute Goal status: INITIAL   PLAN:  PT FREQUENCY: 1-2x/week  PT DURATION: 8 weeks  PLANNED INTERVENTIONS: Therapeutic exercises, Therapeutic activity, Neuromuscular re-education, Balance training, Gait training, Patient/Family education, Self Care, Joint mobilization, Joint manipulation, Stair training, Aquatic Therapy, Dry Needling, Electrical stimulation, Spinal manipulation, Spinal mobilization, Cryotherapy, Moist heat, Taping, Traction, and Ultrasound.  PLAN FOR NEXT SESSION: HEP review; lateral hip and core stability/strengthening  Hilda Liashelsea Lilana Blasko DPT  Hilda Liashelsea Cristiana Yochim, PT 02/15/2022, 11:19 AM

## 2022-02-19 ENCOUNTER — Ambulatory Visit: Payer: Medicare Other | Admitting: Physical Therapy

## 2022-02-20 ENCOUNTER — Ambulatory Visit: Payer: Medicare Other | Attending: Family Medicine | Admitting: Physical Therapy

## 2022-02-20 ENCOUNTER — Encounter: Payer: Self-pay | Admitting: Physical Therapy

## 2022-02-20 DIAGNOSIS — M5459 Other low back pain: Secondary | ICD-10-CM | POA: Insufficient documentation

## 2022-02-20 NOTE — Therapy (Signed)
OUTPATIENT PHYSICAL THERAPY THORACOLUMBAR EVALUATION   Patient Name: Scott Ochoa MRN: [id number] DOB:Jun 23, 1945, 76 y.o., male Today's Date: 02/20/2022   PT End of Session - 02/20/22 1429     Visit Number 7    Number of Visits 17    Date for PT Re-Evaluation 03/23/22    Authorization - Visit Number 7    Authorization - Number of Visits 10    Progress Note Due on Visit 10    PT Start Time 1428    PT Stop Time 1510    PT Time Calculation (min) 42 min    Activity Tolerance Patient tolerated treatment well    Behavior During Therapy [facility] for tasks assessed/performed                   History reviewed. No pertinent past medical history. History reviewed. No pertinent surgical history. There are no problems to display for this patient.   PCP: Marcelino Duster MD  REFERRING PROVIDER: Burman Freestone, FNP  REFERRING DIAG: Lumbar stenosis  Rationale for Evaluation and Treatment: Rehabilitation  THERAPY DIAG:  Other low back pain  ONSET DATE: 2011 - new onset June 2023  SUBJECTIVE:                                                                                                                                                                                           SUBJECTIVE STATEMENT: Pt reports feeling sore after last visit from stretching that went away about a day or 2 after. Played some golf Sunday and felt good during with a little soreness following.   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 Reports prior to this episode he had not had an episode of pain for 3 years, and that usually they only last a couple weeks. Insidious onset of pain. Pain is at the R side of the low back that radiates down posterior R leg with numbess and tingling to the toes. Reports pain is exacerbated by hitting golf balls, bending forward, walking >71miles, sitting/traveling >76mins,  lifting something from the floor. Pt reports current pain 4/10, worst: 6/10, best: 2/10. Pt is retired, is the landlord of a couple Manufacturing engineer, enjoys golfing and going to the gym 5-6x/week and is using strengthening machines with treadmill/elliptical for . Pt denies N/V, B&B changes, unexplained weight fluctuation, saddle paresthesia, fever, night sweats, or unrelenting night pain at this time.   PAIN:  Are you having pain? Yes: NPRS scale: 4/10 Pain location: R side of the low back  radiation down RLE Pain description: sharp, n/t down RLE Aggravating factors: hitting golf balls, bending forward, walking >70miles, sitting/traveling >41mins, lifting something from the floor Relieving factors: ice, chiropractic  PRECAUTIONS: None  WEIGHT BEARING RESTRICTIONS: No  FALLS:  Has patient fallen in last 6 months? No  LIVING ENVIRONMENT: Lives with: lives alone Lives in: House/apartment Stairs: No Has following equipment at home: None  OCCUPATION:    PLOF: Independent  PATIENT GOALS: Decrease pain to be able to play golf   NEXT MD VISIT:   OBJECTIVE:   DIAGNOSTIC FINDINGS:  Sept 2023 1. L2-3: Bulging of the disc. Facet and ligamentous hypertrophy.  Stenosis of both lateral recesses that could cause neural  compression. Findings have worsened slightly since 2011.  2. L3-4: Annular bulging with annular fissures. Facet and  ligamentous hypertrophy. Stenosis of both lateral recesses that  could cause neural compression. Findings have worsened since 2011.  3. L4-5: Annular bulging. Facet and ligamentous hypertrophy. Mild  stenosis of both lateral recesses, slightly worsened since 2011.   PATIENT SURVEYS:  FOTO goal of   SCREENING FOR RED FLAGS: Bowel or bladder incontinence: No Spinal tumors: No Cauda equina syndrome: No Compression fracture: No Abdominal aneurysm: No  COGNITION: Overall cognitive status: Within functional limits for tasks  assessed     SENSATION: WFL  MUSCLE LENGTH: Hamstrings: shortened bilat approx 110d Thomas test: WNL bilat  POSTURE: rounded shoulders, forward head, decreased lumbar lordosis, increased thoracic kyphosis, and weight shift left with R lateral trunk lean  PALPATION: Concordant hip pain to deep palpation of glute max over piriformis, reporting some radicular pain with this No TTP or pain to lumbar parspinal, QL or glute med/superior glute No change in pain with CPA/UPA mobilizations over lumbar segments, hypomobile throughout Hypomobility to mid thoracic spine as well  LUMBAR ROM:   AROM eval  Flexion 25% limited  Extension 25% limited w. pain  Right lateral flexion WNL with pain to side of the LB  Left lateral flexion wnl  Right rotation WNL  Left rotation WNL   (Blank rows = not tested)  LOWER EXTREMITY ROM:     Active  Right eval Left eval  Hip flexion WNL WNL  Hip extension 10% Limited bilat  Hip abduction WNL WNL  Hip adduction    Hip internal rotation 25% limited with hip pain bilat  Hip external rotation WNL WNL  Knee flexion WNL WNL  Knee extension WNL with tension/difficulty with true TKE  Ankle dorsiflexion WNL WNL  Ankle plantarflexion    Ankle inversion    Ankle eversion     (Blank rows = not tested)  LOWER EXTREMITY MMT:    MMT Right eval Left eval  Hip flexion 5 5  Hip extension 4 5  Hip abduction 4 5  Hip adduction    Hip internal rotation 5 5  Hip external rotation 5 5  Knee flexion    Knee extension    Ankle dorsiflexion    Ankle plantarflexion    Ankle inversion    Ankle eversion     (Blank rows = not tested)  LUMBAR SPECIAL TESTS:  Straight leg raise test: Negative, Slump test: Negative, Single leg stance test: R 20sec L ceased at 2min, FABER test: Negative, Trendelenburg sign: Positive, and Thomas test: Negative  Repeated motion neg for lumbar forward flex and ext   FUNCTIONAL TESTS:  5 times sit to stand: 8sec 10 meter walk  test: 1.58m/s SLS R 5/20sec L ceased at 75min SL squat  unable  GAIT: Distance walked: 61m Assistive device utilized: None Level of assistance: Complete Independence Comments: R trendelenberg with decreased R shoulder height/lateral trunk lean throughout ambilation  TODAY'S TREATMENT:                                                                                                                               Ther-Ex Nustep seat 10 UE 14 L3 for gentle lumbar mobility and low level strengthening cuing for SPM in 70s range unilateral prone press with rotation x12 bilat with cuing to prevent concordant hip rotation Scorpion stretch alt x12 with TC initially for proper technique of therex BirdDog 2x 12 with max cuing for coordination with decent carry over Half kneeling anti-rotation GTB 2x 12 bilat with cuing for posture and eccentric control with good carry over Wood chops 10# 2x 6 with demo and cuing for technique for hip and core power with decent carry over LE cross body swings x15 each LE with cuing for lumbopelvic dissociation with some carry over   PATIENT EDUCATION:  Education details: Patient was educated on diagnosis, anatomy and pathology involved, prognosis, role of PT, and was given an HEP, demonstrating exercise with proper form following verbal and tactile cues, and was given a paper hand out to continue exercise at home. Pt was educated on and agreed to plan of care.  Person educated: Patient Education method: Explanation, Demonstration, Verbal cues, and Handouts Education comprehension: verbalized understanding, returned demonstration, verbal cues required, and tactile cues required  HOME EXERCISE PROGRAM: ENFRPNXT  ASSESSMENT:  CLINICAL IMPRESSION: PT continued therex progression for increased lumbopelvic dissociation and hip and core strength with success. Patient is able to comply with all cuing for proper technique of therex with multimodal cuing utilized. Patient  with good effort throughout session and no increased pain. Would benefit from skilled PT to address above deficits and promote optimal return to PLOF.    OBJECTIVE IMPAIRMENTS: Abnormal gait, decreased activity tolerance, decreased balance, decreased coordination, decreased endurance, decreased mobility, difficulty walking, decreased ROM, decreased strength, increased fascial restrictions, impaired perceived functional ability, impaired flexibility, impaired tone, improper body mechanics, postural dysfunction, and pain.   ACTIVITY LIMITATIONS: carrying, lifting, bending, sitting, standing, squatting, transfers, and reach over head  PARTICIPATION LIMITATIONS: meal prep, cleaning, laundry, driving, community activity, and occupation  PERSONAL FACTORS: Age, Past/current experiences, Sex, and Time since onset of injury/illness/exacerbation are also affecting patient's functional outcome.   REHAB POTENTIAL: Good  CLINICAL DECISION MAKING: Evolving/moderate complexity  EVALUATION COMPLEXITY: Moderate   GOALS: Goals reviewed with patient? Yes  SHORT TERM GOALS: Target date: 03/20/2022  Pt will be independent with HEP in order to improve strength and mobility in order to  improve function at home and in the community.  Baseline: 01/25/22 HEP given  Goal status: INITIAL   LONG TERM GOALS: Target date: 04/17/2022  Pt will decrease worst pain as reported on NPRS by at least 3 points in order to demonstrate clinically  significant reduction in pain.  Baseline: 01/25/22 6/10 Goal status: INITIAL  2.  Patient will increase FOTO score to 79 to demonstrate predicted increase in functional mobility to complete ADLs  Baseline:  Goal status: INITIAL  3.  Patient will demonstrate 5/5 R hip MMTs to demonstrate PLOF strength of LLE in order to complete heavy ADLs Baseline: R hip ext and abd 4/5 MMT Goal status: INITIAL  4.  Pt will demonstrate R SLS of 30sec or more in order to demonstrate age  matched static balance norms Baseline: R: 20sec L ceased at a minute Goal status: INITIAL   PLAN:  PT FREQUENCY: 1-2x/week  PT DURATION: 8 weeks  PLANNED INTERVENTIONS: Therapeutic exercises, Therapeutic activity, Neuromuscular re-education, Balance training, Gait training, Patient/Family education, Self Care, Joint mobilization, Joint manipulation, Stair training, Aquatic Therapy, Dry Needling, Electrical stimulation, Spinal manipulation, Spinal mobilization, Cryotherapy, Moist heat, Taping, Traction, and Ultrasound.  PLAN FOR NEXT SESSION: HEP review; lateral hip and core stability/strengthening  Durwin Reges DPT  Durwin Reges, PT 02/20/2022, 3:24 PM

## 2022-02-24 ENCOUNTER — Other Ambulatory Visit: Payer: Self-pay | Admitting: Urology

## 2022-02-26 ENCOUNTER — Encounter: Payer: Self-pay | Admitting: Physical Therapy

## 2022-02-26 ENCOUNTER — Ambulatory Visit: Payer: Medicare Other | Admitting: Physical Therapy

## 2022-02-26 DIAGNOSIS — M5459 Other low back pain: Secondary | ICD-10-CM | POA: Diagnosis not present

## 2022-02-26 NOTE — Therapy (Signed)
OUTPATIENT PHYSICAL THERAPY THORACOLUMBAR EVALUATION   Patient Name: Scott Ochoa MRN: [id number] DOB:09/29/1945, 76 y.o., male Today's Date: 02/26/2022   PT End of Session - 02/26/22 0925     Visit Number 8    Number of Visits 17    Date for PT Re-Evaluation 03/23/22    Authorization - Visit Number 8    Authorization - Number of Visits 10    Progress Note Due on Visit 10    PT Start Time 0923    PT Stop Time 1001    PT Time Calculation (min) 38 min    Activity Tolerance Patient tolerated treatment well    Behavior During Therapy [facility] for tasks assessed/performed                    History reviewed. No pertinent past medical history. History reviewed. No pertinent surgical history. There are no problems to display for this patient.   PCP: Marcelino Duster MD  REFERRING PROVIDER: Burman Freestone, FNP  REFERRING DIAG: Lumbar stenosis  Rationale for Evaluation and Treatment: Rehabilitation  THERAPY DIAG:  Other low back pain  ONSET DATE: 2011 - new onset June 2023  SUBJECTIVE:                                                                                                                                                                                           SUBJECTIVE STATEMENT: Pt reports having some "aches and pains" since last visit, but thinks this is more from inactivity.   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 Reports prior to this episode he had not had an episode of pain for 3 years, and that usually they only last a couple weeks. Insidious onset of pain. Pain is at the R side of the low back that radiates down posterior R leg with numbess and tingling to the toes. Reports pain is exacerbated by hitting golf balls, bending forward, walking >36miles, sitting/traveling >86mins, lifting something from the floor. Pt reports current pain 4/10, worst:  6/10, best: 2/10. Pt is retired, is the landlord of a couple Manufacturing engineer, enjoys golfing and going to the gym 5-6x/week and is using strengthening machines with treadmill/elliptical for . Pt denies N/V, B&B changes, unexplained weight fluctuation, saddle paresthesia, fever, night sweats, or unrelenting night pain at this time.   PAIN:  Are you having pain? Yes: NPRS scale: 4/10 Pain location: R side of the low back radiation down RLE Pain description: sharp, n/t down RLE Aggravating factors: hitting golf  balls, bending forward, walking >2miles, sitting/traveling >30mins, lifting something from the floor Relieving factors: ice, chiropractic  PRECAUTIONS: None  WEIGHT BEARING RESTRICTIONS: No  FALLS:  Has patient fallen in last 6 months? No  LIVING ENVIRONMENT: Lives with: lives alone Lives in: House/apartment Stairs: No Has following equipment at home: None  OCCUPATION:    PLOF: Independent  PATIENT GOALS: Decrease pain to be able to play golf   NEXT MD VISIT:   OBJECTIVE:   DIAGNOSTIC FINDINGS:  Sept 2023 1. L2-3: Bulging of the disc. Facet and ligamentous hypertrophy.  Stenosis of both lateral recesses that could cause neural  compression. Findings have worsened slightly since 2011.  2. L3-4: Annular bulging with annular fissures. Facet and  ligamentous hypertrophy. Stenosis of both lateral recesses that  could cause neural compression. Findings have worsened since 2011.  3. L4-5: Annular bulging. Facet and ligamentous hypertrophy. Mild  stenosis of both lateral recesses, slightly worsened since 2011.   PATIENT SURVEYS:  FOTO goal of   SCREENING FOR RED FLAGS: Bowel or bladder incontinence: No Spinal tumors: No Cauda equina syndrome: No Compression fracture: No Abdominal aneurysm: No  COGNITION: Overall cognitive status: Within functional limits for tasks assessed     SENSATION: WFL  MUSCLE LENGTH: Hamstrings: shortened [facility]([phone] Danker10d Thomas test: WNL bilat  POSTURE: rounded shoulders, forward head, dTre 5[place] unable  GAIT: Distance  walked: 2m Assistive device utilized: None Level of assistance:  Complete Independence Comments: R trendelenberg with decreased R shoulder height/lateral trunk lean throughout ambilation  TODAY'S TREATMENT:                                                                                                                               Ther-Ex Nustep seat 10 UE 14 L3 for gentle lumbar mobility and low level strengthening cuing for SPM in 70s range Scorpion stretch alt x12 with TC initially for proper technique of therex Cat/Cow x12 with good carry over of cuing for proper technique BirdDog x12 with good carry over of initial cuing for technique Plank 20sec hold; plank with alt hip ext x12 with cuing to maintain plank position with hip ext D2 PNF 2x12 bilat BluTB with cuing for eccentric control with good carry over LE cross body swings x15 each LE with cuing for lumbopelvic dissociation with some carry over   PATIENT EDUCATION:  Education details: Patient was educated on diagnosis, anatomy and pathology involved, prognosis, role of PT, and was given an HEP, demonstrating exercise with proper form following verbal and tactile cues, and was given a paper hand out to continue exercise at home. Pt was educated on and agreed to plan of care.  Person educated: Patient Education method: Explanation, Demonstration, Verbal cues, and Handouts Education comprehension: verbalized understanding, returned demonstration, verbal cues required, and tactile cues required  HOME EXERCISE PROGRAM: ENFRPNXT  ASSESSMENT:  CLINICAL IMPRESSION: PT continued therex progression for increased lumbopelvic dissociation and hip and core strength with success. Patient is able to comply with all cuing for proper technique of therex with multimodal cuing utilized. Patient with good effort throughout session and no increased pain. Would benefit from skilled PT to address above deficits and promote optimal return to  PLOF.    OBJECTIVE IMPAIRMENTS: Abnormal gait, decreased activity tolerance, decreased balance, decreased coordination, decreased endurance, decreased mobility, difficulty walking, decreased ROM, decreased strength, increased fascial restrictions, impaired perceived functional ability, impaired flexibility, impaired tone, improper body mechanics, postural dysfunction, and pain.   ACTIVITY LIMITATIONS: carrying, lifting, bending, sitting, standing, squatting, transfers, and reach over head  PARTICIPATION LIMITATIONS: meal prep, cleaning, laundry, driving, community activity, and occupation  PERSONAL FACTORS: Age, Past/current experiences, Sex, and Time since onset of injury/illness/exacerbation are also affecting patient's functional outcome.   REHAB POTENTIAL: Good  CLINICAL DECISION MAKING: Evolving/moderate complexity  EVALUATION COMPLEXITY: Moderate   GOALS: Goals reviewed with patient? Yes  SHORT TERM GOALS: Target date: 03/26/2022  Pt will be independent with HEP in order to improve strength and mobility in order to  improve function at home and in the community.  Baseline: 01/25/22 HEP given  Goal status: INITIAL   LONG TERM GOALS: Target date: 04/23/2022  Pt will decrease worst pain as reported on NPRS by at least 3 points in order to demonstrate clinically significant reduction in pain.  Baseline: 01/25/22 6/10 Goal status: INITIAL  2.  Patient will increase FOTO score to 79 to demonstrate  predicted increase in functional mobility to complete ADLs  Baseline:  Goal status: INITIAL  3.  Patient will demonstrate 5/5 R hip MMTs to demonstrate PLOF strength of LLE in order to complete heavy ADLs Baseline: R hip ext and abd 4/5 MMT Goal status: INITIAL  4.  Pt will demonstrate R SLS of 30sec or more in order to demonstrate age matched static balance norms Baseline: R: 20sec L ceased at a minute Goal status: INITIAL   PLAN:  PT FREQUENCY: 1-2x/week  PT DURATION: 8  weeks  PLANNED INTERVENTIONS: Therapeutic exercises, Therapeutic activity, Neuromuscular re-education, Balance training, Gait training, Patient/Family education, Self Care, Joint mobilization, Joint manipulation, Stair training, Aquatic Therapy, Dry Needling, Electrical stimulation, Spinal manipulation, Spinal mobilization, Cryotherapy, Moist heat, Taping, Traction, and Ultrasound.  PLAN FOR NEXT SESSION: HEP review; lateral hip and core stability/strengthening  Hilda Lias DPT  Hilda Lias, PT 02/26/2022, 10:03 AM

## 2022-02-28 ENCOUNTER — Encounter: Payer: Self-pay | Admitting: Physical Therapy

## 2022-02-28 ENCOUNTER — Ambulatory Visit: Payer: Medicare Other | Admitting: Physical Therapy

## 2022-02-28 DIAGNOSIS — M5459 Other low back pain: Secondary | ICD-10-CM

## 2022-02-28 NOTE — Therapy (Signed)
OUTPATIENT PHYSICAL THERAPY THORACOLUMBAR EVALUATION   Patient Name: Scott Ochoa MRN: [id number] DOB:03-08-1946, 76 y.o., male Today's Date: 02/28/2022   PT End of Session - 02/28/22 0923     Visit Number 9    Number of Visits 17    Date for PT Re-Evaluation 03/23/22    Authorization - Number of Visits 9    Progress Note Due on Visit 10    PT Start Time 0919    PT Stop Time 0959    PT Time Calculation (min) 40 min    Activity Tolerance Patient tolerated treatment well    Behavior During Therapy [facility] for tasks assessed/performed                     History reviewed. No pertinent past medical history. History reviewed. No pertinent surgical history. There are no problems to display for this patient.   PCP: Marcelino Duster MD  REFERRING PROVIDER: Burman Freestone, FNP  REFERRING DIAG: Lumbar stenosis  Rationale for Evaluation and Treatment: Rehabilitation  THERAPY DIAG:  Other low back pain  ONSET DATE: 2011 - new onset June 2023  SUBJECTIVE:                                                                                                                                                                                           SUBJECTIVE STATEMENT: Pt reports having some "aches and pains" since last visit, but thinks this is more from inactivity.   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 Reports prior to this episode he had not had an episode of pain for 3 years, and that usually they only last a couple weeks. Insidious onset of pain. Pain is at the R side of the low back that radiates down posterior R leg with numbess and tingling to the toes. Reports pain is exacerbated by hitting golf balls, bending forward, walking >69miles, sitting/traveling >65mins, lifting something from the floor. Pt reports current pain 4/10, worst: 6/10, best: 2/10. Pt is retired, is  the landlord of a couple Manufacturing engineer, enjoys golfing and going to the gym 5-6x/week and is using strengthening machines with treadmill/elliptical for . Pt denies N/V, B&B changes, unexplained weight fluctuation, saddle paresthesia, fever, night sweats, or unrelenting night pain at this time.   PAIN:  Are you having pain? Yes: NPRS scale: 4/10 Pain location: R side of the low back radiation down RLE Pain description: sharp, n/t down RLE Aggravating factors: hitting golf balls, bending forward, walking >81miles, sitting/traveling >62mins,  lifting something from the floor Relieving factors: ice, chiropractic  PRECAUTIONS: None  WEIGHT BEARING RESTRICTIONS: No  FALLS:  Has patient fallen in last 6 months? No  LIVING ENVIRONMENT: Lives with: lives alone Lives in: House/apartment Stairs: No Has following equipment at home: None  OCCUPATION:    PLOF: Independent  PATIENT GOALS: Decrease pain to be able to play golf   NEXT MD VISIT:   OBJECTIVE:   DIAGNOSTIC FINDINGS:  Sept 2023 1. L2-3: Bulging of the disc. Facet and ligamentous hypertrophy.  Stenosis of both lateral recesses that could cause neural  compression. Findings have worsened slightly since 2011.  2. L3-4: Annular bulging with annular fissures. Facet and  ligamentous hypertrophy. Stenosis of both lateral recesses that  could cause neural compression. Findings have worsened since 2011.  3. L4-5: Annular bulging. Facet and ligamentous hypertrophy. Mild  stenosis of both lateral recesses, slightly worsened since 2011.   PATIENT SURVEYS:  FOTO goal of   SCREENING FOR RED FLAGS: Bowel or bladder incontinence: No Spinal tumors: No Cauda equina syndrome: No Compression fracture: No Abdominal aneurysm: No  COGNITION: Overall cognitive status: Within functional limits for tasks assessed     SENSATION: WFL  MUSCLE LENGTH: Hamstrings: shortened bilat approx 110d Thomas test: WNL bilat  POSTURE:  rounded shoulders, forward head, decreased lumbar lordosis, increased thoracic kyphosis, and weight shift left with R lateral trunk lean  PALPATION: Concordant hip pain to deep palpation of glute max over piriformis, reporting some radicular pain with this No TTP or pain to lumbar parspinal, QL or glute med/superior glute No change in pain with CPA/UPA mobilizations over lumbar segments, hypomobile throughout Hypomobility to mid thoracic spine as well  LUMBAR ROM:   AROM eval  Flexion 25% limited  Extension 25% limited w. pain  Right lateral flexion WNL with pain to side of the LB  Left lateral flexion wnl  Right rotation WNL  Left rotation WNL   (Blank rows = not tested)  LOWER EXTREMITY ROM:     Active  Right eval Left eval  Hip flexion WNL WNL  Hip extension 10% Limited bilat  Hip abduction WNL WNL  Hip adduction    Hip internal rotation 25% limited with hip pain bilat  Hip external rotation WNL WNL  Knee flexion WNL WNL  Knee extension WNL with tension/difficulty with true TKE  Ankle dorsiflexion WNL WNL  Ankle plantarflexion    Ankle inversion    Ankle eversion     (Blank rows = not tested)  LOWER EXTREMITY MMT:    MMT Right eval Left eval  Hip flexion 5 5  Hip extension 4 5  Hip abduction 4 5  Hip adduction    Hip internal rotation 5 5  Hip external rotation 5 5  Knee flexion    Knee extension    Ankle dorsiflexion    Ankle plantarflexion    Ankle inversion    Ankle eversion     (Blank rows = not tested)  LUMBAR SPECIAL TESTS:  Straight leg raise test: Negative, Slump test: Negative, Single leg stance test: R 20sec L ceased at , FABER test: Negative, Trendelenburg sign: Positive, and Thomas test: Negative  Repeated motion neg for lumbar forward flex and ext   FUNCTIONAL TESTS:  5 times sit to stand: 8sec 10 meter walk test: 1.63m/s SLS R 5/20sec L ceased at SL squat unable  GAIT: Distance walked: 69m Assistive device utilized:  None Level of assistance: Complete Independence Comments: R trendelenberg with decreased  R shoulder height/lateral trunk lean throughout ambilation  TODAY'S TREATMENT:                                                                                                                               Ther-Ex Nustep seat 10 UE 14 L3 for gentle lumbar mobility and low level strengthening cuing for SPM in 70s range Half kneeling with ball between hip and wall lumbo-thoracic rotation x12 bilat Cat/Cow x12 with good carry over of cuing for proper technique Plank with alt hip ext 2x 12 with cuing to maintain plank position with hip ext Spider plank 2x 12 with demo and cuing needed for proper technique Alt over the shoulder 15# wt'd ball throw (squat to over the shoulder twist) 2x 30sec LE cross body swings x15 each LE with cuing for lumbopelvic dissociation with some carry over   PATIENT EDUCATION:  Education details: Patient was educated on diagnosis, anatomy and pathology involved, prognosis, role of PT, and was given an HEP, demonstrating exercise with proper form following verbal and tactile cues, and was given a paper hand out to continue exercise at home. Pt was educated on and agreed to plan of care.  Person educated: Patient Education method: Explanation, Demonstration, Verbal cues, and Handouts Education comprehension: verbalized understanding, returned demonstration, verbal cues required, and tactile cues required  HOME EXERCISE PROGRAM: ENFRPNXT  ASSESSMENT:  CLINICAL IMPRESSION: PT continued therex progression for increased lumbopelvic dissociation and hip and core strength with success. PT increased power demand this session with no increased pain. Patient is able to comply with all cuing for proper technique of therex with multimodal cuing utilized. Patient with good effort throughout session and no increased pain. Would benefit from skilled PT to address above deficits and promote  optimal return to PLOF.    OBJECTIVE IMPAIRMENTS: Abnormal gait, decreased activity tolerance, decreased balance, decreased coordination, decreased endurance, decreased mobility, difficulty walking, decreased ROM, decreased strength, increased fascial restrictions, impaired perceived functional ability, impaired flexibility, impaired tone, improper body mechanics, postural dysfunction, and pain.   ACTIVITY LIMITATIONS: carrying, lifting, bending, sitting, standing, squatting, transfers, and reach over head  PARTICIPATION LIMITATIONS: meal prep, cleaning, laundry, driving, community activity, and occupation  PERSONAL FACTORS: Age, Past/current experiences, Sex, and Time since onset of injury/illness/exacerbation are also affecting patient's functional outcome.   REHAB POTENTIAL: Good  CLINICAL DECISION MAKING: Evolving/moderate complexity  EVALUATION COMPLEXITY: Moderate   GOALS: Goals reviewed with patient? Yes  SHORT TERM GOALS: Target date: 03/28/2022  Pt will be independent with HEP in order to improve strength and mobility in order to  improve function at home and in the community.  Baseline: 01/25/22 HEP given  Goal status: INITIAL   LONG TERM GOALS: Target date: 04/25/2022  Pt will decrease worst pain as reported on NPRS by at least 3 points in order to demonstrate clinically significant reduction in pain.  Baseline: 01/25/22 6/10 Goal status: INITIAL  2.  Patient will increase FOTO score  to 79 to demonstrate predicted increase in functional mobility to complete ADLs  Baseline:  Goal status: INITIAL  3.  Patient will demonstrate 5/5 R hip MMTs to demonstrate PLOF strength of LLE in order to complete heavy ADLs Baseline: R hip ext and abd 4/5 MMT Goal status: INITIAL  4.  Pt will demonstrate R SLS of 30sec or more in order to demonstrate age matched static balance norms Baseline: R: 20sec L ceased at a minute Goal status: INITIAL   PLAN:  PT FREQUENCY:  1-2x/week  PT DURATION: 8 weeks  PLANNED INTERVENTIONS: Therapeutic exercises, Therapeutic activity, Neuromuscular re-education, Balance training, Gait training, Patient/Family education, Self Care, Joint mobilization, Joint manipulation, Stair training, Aquatic Therapy, Dry Needling, Electrical stimulation, Spinal manipulation, Spinal mobilization, Cryotherapy, Moist heat, Taping, Traction, and Ultrasound.  PLAN FOR NEXT SESSION: HEP review; lateral hip and core stability/strengthening  Hilda Liashelsea Reginna Sermeno DPT  Hilda Liashelsea Colden Samaras, PT 02/28/2022, 9:58 AM

## 2022-03-05 ENCOUNTER — Other Ambulatory Visit: Payer: Medicare Other

## 2022-03-05 ENCOUNTER — Encounter: Payer: Self-pay | Admitting: Physical Therapy

## 2022-03-05 ENCOUNTER — Other Ambulatory Visit: Payer: Self-pay | Admitting: *Deleted

## 2022-03-05 ENCOUNTER — Ambulatory Visit: Payer: Medicare Other | Admitting: Physical Therapy

## 2022-03-05 DIAGNOSIS — M5459 Other low back pain: Secondary | ICD-10-CM

## 2022-03-05 DIAGNOSIS — R972 Elevated prostate specific antigen [PSA]: Secondary | ICD-10-CM

## 2022-03-05 NOTE — Therapy (Signed)
OUTPATIENT PHYSICAL THERAPY THORACOLUMBAR PROGRESS NOTE Reporting Period 01/25/22 - 03/05/22   Patient Name: Scott Ochoa MRN: [id number] DOB:11/18/1945, 76 y.o., male Today's Date: 03/05/2022   PT End of Session - 03/05/22 0838     Visit Number 10    Number of Visits 17    Date for PT Re-Evaluation 03/23/22    Authorization - Visit Number 10    Authorization - Number of Visits 17    Progress Note Due on Visit 10    PT Start Time 0833    PT Stop Time 0905    PT Time Calculation (min) 32 min    Activity Tolerance Patient tolerated treatment well    Behavior During Therapy [facility] for tasks assessed/performed                      History reviewed. No pertinent past medical history. History reviewed. No pertinent surgical history. There are no problems to display for this patient.   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 Reports prior to this episode he had not had an episode of pain for 3 years, and that usually they only last a couple weeks. Insidious onset of pain. Pain is at the R side of the low back that radiates down posterior R leg with numbess and tingling to the toes. Reports pain is exacerbated by hitting golf balls, bending forward, walking >39mles, sitting/traveling >370ms, lifting something from the floor. Pt reports current pain  4/10, worst: 6/10, best: 2/10. Pt is retired, is the landlord of a couple reEngineer, building servicesenjoys golfing and going to the gym 5-6x/week and is using strengthening machines with treadmill/elliptical for 3046m. Pt denies N/V, B&B changes, unexplained weight fluctuation, saddle paresthesia, fever, night sweats, or unrelenting night pain at this time.   PAIN:  Are you having pain? Yes: NPRS scale: 4/10 Pain location: R side of the low back radiation down RLE Pain description: sharp, n/t down RLE Aggravating factors: hitting  golf balls, bending forward, walking >27mles, sitting/traveling >346ms, lifting something from the floor Relieving factors: ice, chiropractic  PRECAUTIONS: None  WEIGHT BEARING RESTRICTIONS: No  FALLS:  Has patient fallen in last 6 months? No  LIVING ENVIRONMENT: Lives with: lives alone Lives in: House/apartment Stairs: No Has following equipment at home: None  OCCUPATION:    PLOF: Independent  PATIENT GOALS: Decrease pain to be able to play golf   NEXT MD VISIT:   OBJECTIVE:   DIAGNOSTIC FINDINGS:  Sept 2023 1. L2-3: Bulging of the disc. Facet and ligamentous hypertrophy.  Stenosis of both lateral recesses that could cause neural  compression. Findings have worsened slightly since 2011.  2. L3-4: Annular bulging with annular fissures. Facet and  ligamentous hypertrophy. Stenosis of both lateral recesses that  could cause neural compression. Findings have worsened since 2011.  3. L4-5: Annular bulging. Facet and ligamentous hypertrophy. Mild  stenosis of both lateral recesses, slightly worsened since 2011.   PATIENT SURVEYS:  FOTO goal of   SCREENING FOR RED FLAGS: Bowel or bladder incontinence: No Spinal tumors: No Cauda equina syndrome: No Compression fracture: No Abdominal aneurysm: No  COGNITION: Overall cognitive status: Within functional limits for tasks assessed     SENSATION: WFL  MUSCLE LENGTH: Hamstrings: shortened bilat  approx 110d Thomas test: WNL bilat  POSTURE: rounded shoulders, forward head, decreased lumbar lordosis, increased thoracic kyphosis, and weight shift left with R lateral trunk lean  PALPATION: Concordant hip pain to deep palpation of glute max over piriformis, reporting some radicular pain with this No TTP or pain to lumbar parspinal, QL or glute med/superior glute No change in pain with CPA/UPA mobilizations over lumbar segments, hypomobile throughout Hypomobility to mid thoracic spine as well  LUMBAR ROM:   AROM eval  Flexion 25% limited  Extension 25% limited w. pain  Right lateral flexion WNL with pain to side of the LB  Left lateral flexion wnl  Right rotation WNL  Left rotation WNL   (Blank rows = not tested)  LOWER EXTREMITY ROM:     Active  Right eval Left eval  Hip flexion WNL WNL  Hip extension 10% Limited bilat  Hip abduction WNL WNL  Hip adduction    Hip internal rotation 25% limited with hip pain bilat  Hip external rotation WNL WNL  Knee flexion WNL WNL  Knee extension WNL with tension/difficulty with true TKE  Ankle dorsiflexion WNL WNL  Ankle plantarflexion    Ankle inversion    Ankle eversion     (Blank rows = not tested)  LOWER EXTREMITY MMT:    MMT Right eval Left eval  Hip flexion 5 5  Hip extension 4 5  Hip abduction 4 5  Hip adduction    Hip internal rotation 5 5  Hip external rotation 5 5  Knee flexion    Knee extension    Ankle dorsiflexion    Ankle plantarflexion    Ankle inversion    Ankle eversion     (Blank rows = not tested)  LUMBAR SPECIAL TESTS:  Straight leg raise test: Negative, Slump test: Negative, Single leg stance test: R 20sec L ceased at 57m22m FABER test: Negative, Trendelenburg sign: Positive, and Thomas test: Negative  Repeated motion neg for lumbar forward flex and ext   FUNCTIONAL TESTS:  5 times sit to stand: 8sec 10 meter walk test: 1.55m2mLS R 5/20sec L ceased at 57min57m squat  unable  GAIT: Distance walked: 91m A29mtive device utilized: None Level of  assistance: Complete Independence Comments: R trendelenberg with decreased R shoulder height/lateral trunk lean throughout ambilation  TODAY'S TREATMENT:                                                                                                                               Ther-Ex Nustep seat 10 UE 14 L3 for gentle lumbar mobility and low level strengthening cuing for SPM in 70s range  SLS RLE 38sec  PT reviewed the following HEP with patient with patient able to demonstrate a set of the following with min cuing for correction needed. PT educated patient on parameters of therex (how/when to inc/decrease intensity, frequency, rep/set range, stretch hold time, and purpose of therex) with verbalized understanding.  Access Code: [email or web address] - Sumo Squat with Dumbbell  - 1 x daily - 1 x weekly - 3 sets - 8-12 reps - Deadlift With Dumbbells  - 1 x daily - 1 x weekly - 3 sets - 8-12 reps - Full Leg Press  - 1 x daily - 1 x weekly - 3 sets - 8-12 reps - Standing Cable Hip Extension  - 1 x daily - 1 x weekly - 3 sets - 8-12 reps   PATIENT EDUCATION:  Education details: Patient was educated on diagnosis, anatomy and pathology involved, prognosis, role of PT, and was given an HEP, demonstrating exercise with proper form following verbal and tactile cues, and was given a paper hand out to continue exercise at home. Pt was educated on and agreed to plan of care.  Person educated: Patient Education method: Explanation, Demonstration, Verbal cues, and Handouts Education comprehension: verbalized understanding, returned demonstration, verbal cues required, and tactile cues required  HOME EXERCISE PROGRAM: ENFRPNXT  ASSESSMENT:  CLINICAL IMPRESSION: PT reassessed goals this session where patient has met pain, partial strength, and balance goals. Remaining goals in hip ext strength, and subjective report of disability.  Pt is able to veralize and demonstrate understanding of HEP recommendations. Pt would benefit from skilled PT 1x/week with gym program for 4 weeks to allow for independence toward d/c from formal PT.       OBJECTIVE IMPAIRMENTS: Abnormal gait, decreased activity tolerance, decreased balance, decreased coordination, decreased endurance, decreased mobility, difficulty walking, decreased ROM, decreased strength, increased fascial restrictions, impaired perceived functional ability, impaired flexibility, impaired tone, improper body mechanics, postural dysfunction, and pain.   ACTIVITY LIMITATIONS: carrying, lifting, bending, sitting, standing, squatting, transfers, and reach over head  PARTICIPATION LIMITATIONS: meal prep, cleaning, laundry, driving, community activity, and occupation  PERSONAL FACTORS: Age, Past/current experiences, Sex, and Time since onset of injury/illness/exacerbation are also affecting patient's functional outcome.   REHAB POTENTIAL: Good  CLINICAL DECISION MAKING: Evolving/moderate complexity  EVALUATION COMPLEXITY: Moderate   GOALS: Goals reviewed with patient? Yes  SHORT TERM GOALS: Target date: 04/02/2022  Pt will be independent with HEP in order to improve strength and mobility in order to  improve function at home and in the community.  Baseline:  01/25/22 HEP given  Goal status: INITIAL   LONG TERM GOALS: Target date: 04/30/2022  Pt will decrease worst pain as reported on NPRS by at least 3 points in order to demonstrate clinically significant reduction in pain.  Baseline: 01/25/22 6/10; 03/05/22 3/10 Goal status: MET  2.  Patient will increase FOTO score to 79 to demonstrate predicted increase in functional mobility to complete ADLs  Baseline: 72; 03/05/22 71 Goal status: ONGOING  3.  Patient will demonstrate 5/5 R hip MMTs to demonstrate PLOF strength of LLE in order to complete heavy ADLs Baseline: R hip ext and abd 4/5 MMT; 03/05/22 ext 4+/5 abd  5/5 Goal status: PARTIALLY MET  4.  Pt will demonstrate R SLS of 60sec or more in order to demonstrate unaffected side static balance norms Baseline: R: 20sec L ceased at a minute; R: 38sec L 60sec Goal status: REVISED   PLAN:  PT FREQUENCY: 1-2x/week  PT DURATION: 8 weeks  PLANNED INTERVENTIONS: Therapeutic exercises, Therapeutic activity, Neuromuscular re-education, Balance training, Gait training, Patient/Family education, Self Care, Joint mobilization, Joint manipulation, Stair training, Aquatic Therapy, Dry Needling, Electrical stimulation, Spinal manipulation, Spinal mobilization, Cryotherapy, Moist heat, Taping, Traction, and Ultrasound.  PLAN FOR NEXT SESSION: HEP review; lateral hip and core stability/strengthening  Durwin Reges DPT  Durwin Reges, PT 03/05/2022, 10:41 AM

## 2022-03-05 NOTE — Addendum Note (Signed)
Addended by: Namrata Dangler L on: 03/05/2022 09:44 AM   Modules accepted: Orders  

## 2022-03-06 LAB — PSA: Prostate Specific Ag, Serum: 5.5 ng/mL — ABNORMAL HIGH (ref 0.0–4.0)

## 2022-03-08 ENCOUNTER — Ambulatory Visit: Payer: Medicare Other | Admitting: Physical Therapy

## 2022-03-13 ENCOUNTER — Ambulatory Visit: Payer: Medicare Other | Admitting: Physical Therapy

## 2022-03-15 ENCOUNTER — Ambulatory Visit: Payer: Medicare Other | Admitting: Physical Therapy

## 2022-03-21 ENCOUNTER — Ambulatory Visit: Payer: Medicare Other | Attending: Family Medicine | Admitting: Physical Therapy

## 2022-03-21 ENCOUNTER — Encounter: Payer: Self-pay | Admitting: Physical Therapy

## 2022-03-21 DIAGNOSIS — M5459 Other low back pain: Secondary | ICD-10-CM | POA: Diagnosis not present

## 2022-03-21 NOTE — Therapy (Signed)
OUTPATIENT PHYSICAL THERAPY THORACOLUMBAR PROGRESS NOTE Reporting Period 01/25/22 - 03/05/22   Patient Name: Scott Ochoa MRN: [id number] DOB:12/31/1945, 77 y.o., male Today's Date: 03/21/2022   PT End of Session - 03/21/22 0841     Visit Number 11    Number of Visits 17    Date for PT Re-Evaluation 03/23/22    Authorization - Visit Number 11    Authorization - Number of Visits 17    Progress Note Due on Visit 20    PT Start Time 0836    PT Stop Time 0915    PT Time Calculation (min) 39 min    Activity Tolerance Patient tolerated treatment well    Behavior During Therapy [facility] for tasks assessed/performed                       History reviewed. No pertinent past medical history. History reviewed. No pertinent surgical history. There are no problems to display for this patient.   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 Reports prior to this episode he had not had an episode of pain for 3 years, and that usually they only last a couple weeks. Insidious onset of pain. Pain is at the R side of the low back that radiates down posterior R leg with numbess and tingling to the toes. Reports pain is exacerbated by hitting golf balls, bending forward, walking >33mles, sitting/traveling >3106ms, lifting something from the floor. Pt reports current pain  4/10, worst: 6/10, best: 2/10. Pt is retired, is the landlord of a couple reEngineer, building servicesenjoys golfing and going to the gym 5-6x/week and is using strengthening machines with treadmill/elliptical for 3056m. Pt denies N/V, B&B changes, unexplained weight fluctuation, saddle paresthesia, fever, night sweats, or unrelenting night pain at this time.   PAIN:  Are you having pain? Yes: NPRS scale: 4/10 Pain location: R side of the low back radiation down RLE Pain description: sharp, n/t down RLE Aggravating factors:  hitting golf balls, bending forward, walking >57mles, sitting/traveling >324ms, lifting something from the floor Relieving factors: ice, chiropractic  PRECAUTIONS: None  WEIGHT BEARING RESTRICTIONS: No  FALLS:  Has patient fallen in last 6 months? No  LIVING ENVIRONMENT: Lives with: lives alone Lives in: House/apartment Stairs: No Has following equipment at home: None  OCCUPATION:    PLOF: Independent  PATIENT GOALS: Decrease pain to be able to play golf   NEXT MD VISIT:   OBJECTIVE:   DIAGNOSTIC FINDINGS:  Sept 2023 1. L2-3: Bulging of the disc. Facet and ligamentous hypertrophy.  Stenosis of both lateral recesses that could cause neural  compression. Findings have worsened slightly since 2011.  2. L3-4: Annular bulging with annular fissures. Facet and  ligamentous hypertrophy. Stenosis of both lateral recesses that  could cause neural compression. Findings have worsened since 2011.  3. L4-5: Annular bulging. Facet and ligamentous hypertrophy. Mild  stenosis of both lateral recesses, slightly worsened since 2011.   PATIENT SURVEYS:  FOTO goal of   SCREENING FOR RED FLAGS: Bowel or bladder incontinence: No Spinal tumors: No Cauda equina syndrome: No Compression fracture: No Abdominal aneurysm: No  COGNITION: Overall cognitive status: Within functional limits for tasks assessed     SENSATION: WFL  MUSCLE LENGTH: Hamstrings: shortened bilat  approx 110d Thomas test: WNL bilat  POSTURE: rounded shoulders, forward head, decreased lumbar lordosis, increased thoracic kyphosis, and weight shift left with R lateral trunk lean  PALPATION: Concordant hip pain to deep palpation of glute max over piriformis, reporting some radicular pain with this No TTP or pain to lumbar parspinal, QL or glute med/superior glute No change in pain with CPA/UPA mobilizations over lumbar segments, hypomobile throughout Hypomobility to mid thoracic spine as well  LUMBAR ROM:   AROM eval  Flexion 25% limited  Extension 25% limited w. pain  Right lateral flexion WNL with pain to side of the LB  Left lateral flexion wnl  Right rotation WNL  Left rotation WNL   (Blank rows = not tested)  LOWER EXTREMITY ROM:     Active  Right eval Left eval  Hip flexion WNL WNL  Hip extension 10% Limited bilat  Hip abduction WNL WNL  Hip adduction    Hip internal rotation 25% limited with hip pain bilat  Hip external rotation WNL WNL  Knee flexion WNL WNL  Knee extension WNL with tension/difficulty with true TKE  Ankle dorsiflexion WNL WNL  Ankle plantarflexion    Ankle inversion    Ankle eversion     (Blank rows = not tested)  LOWER EXTREMITY MMT:    MMT Right eval Left eval  Hip flexion 5 5  Hip extension 4 5  Hip abduction 4 5  Hip adduction    Hip internal rotation 5 5  Hip external rotation 5 5  Knee flexion    Knee extension    Ankle dorsiflexion    Ankle plantarflexion    Ankle inversion    Ankle eversion     (Blank rows = not tested)  LUMBAR SPECIAL TESTS:  Straight leg raise test: Negative, Slump test: Negative, Single leg stance test: R 20sec L ceased at 91m64m FABER test: Negative, Trendelenburg sign: Positive, and Thomas test: Negative  Repeated motion neg for lumbar forward flex and ext   FUNCTIONAL TESTS:  5 times sit to stand: 8sec 10 meter walk test: 1.24m22mLS R 5/20sec L ceased at 91min22m squat  unable  GAIT: Distance walked: 36m A92mtive device utilized: None Level  of assistance: Complete Independence Comments: R trendelenberg with decreased R shoulder height/lateral trunk lean throughout ambilation  TODAY'S TREATMENT:                                                                                                                               Ther-Ex Nustep seat 10 UE 14 L3 for gentle lumbar mobility and low level strengthening cuing for SPM in 70s range  Sumo squat 30# KB x10 with cuing for neutral spine with good carry over Deadlift with 30# KB x10 with cuing for therex technique with good carry over Leg Press 100# x10 with cuing for full movement MATRIX hip ext 25# x10 bilat with good carry over of cuing for glute max activation   Sumo squat 30# KB x10 Deadlift with 30# KB x10  Leg Press 100# x10  MATRIX hip ext 25# x10  *much better carry over of cuing from first circuit  Thoracolumbar rotation stretch 30sec bilat Forward fold stretch 30sec  Education on dynamic warmup prior to exercise session and static stretching following   PATIENT EDUCATION:  Education details: Patient was educated on diagnosis, anatomy and pathology involved, prognosis, role of PT, and was given an HEP, demonstrating exercise with proper form following verbal and tactile cues, and was given a paper hand out to continue exercise at home. Pt was educated on and agreed to plan of care.  Person educated: Patient Education method: Explanation, Demonstration, Verbal cues, and Handouts Education comprehension: verbalized understanding, returned demonstration, verbal cues required, and tactile cues required  HOME EXERCISE PROGRAM: ENFRPNXT  ASSESSMENT:  CLINICAL IMPRESSION: PT reviewed HEP therex with patient with success. Pt requires mod multi-modal cuing for first circuit of therex with better carry over (min cuing needed) for second. Patient with most cuing needed for neutral spine with  natural posture of excessive thoracic kyphosis. PT will continue progression as able.       OBJECTIVE IMPAIRMENTS: Abnormal gait, decreased activity tolerance, decreased balance, decreased coordination, decreased endurance, decreased mobility, difficulty walking, decreased ROM, decreased strength, increased fascial restrictions, impaired perceived functional ability, impaired flexibility, impaired tone, improper body mechanics, postural dysfunction, and pain.   ACTIVITY LIMITATIONS: carrying, lifting, bending, sitting, standing, squatting, transfers, and reach over head  PARTICIPATION LIMITATIONS: meal prep, cleaning, laundry, driving, community activity, and occupation  PERSONAL FACTORS: Age, Past/current experiences, Sex, and Time since onset of injury/illness/exacerbation are also affecting patient's functional outcome.   REHAB POTENTIAL: Good  CLINICAL DECISION MAKING: Evolving/moderate complexity  EVALUATION COMPLEXITY: Moderate   GOALS: Goals reviewed with patient? Yes  SHORT TERM GOALS: Target date: 04/18/2022  Pt will be independent with HEP in order to improve strength and mobility in order to  improve function at home and in the community.  Baseline: 01/25/22 HEP given  Goal status: INITIAL   LONG TERM GOALS: Target date: 05/16/2022  Pt will decrease worst pain as reported on NPRS by at least 3 points in order to demonstrate  clinically significant reduction in pain.  Baseline: 01/25/22 6/10; 03/05/22 3/10 Goal status: MET  2.  Patient will increase FOTO score to 79 to demonstrate predicted increase in functional mobility to complete ADLs  Baseline: 72; 03/05/22 71 Goal status: ONGOING  3.  Patient will demonstrate 5/5 R hip MMTs to demonstrate PLOF strength of LLE in order to complete heavy ADLs Baseline: R hip ext and abd 4/5 MMT; 03/05/22 ext 4+/5 abd 5/5 Goal status: PARTIALLY MET  4.  Pt will demonstrate R SLS of 60sec or more in order to demonstrate  unaffected side static balance norms Baseline: R: 20sec L ceased at a minute; R: 38sec L 60sec Goal status: REVISED   PLAN:  PT FREQUENCY: 1-2x/week  PT DURATION: 8 weeks  PLANNED INTERVENTIONS: Therapeutic exercises, Therapeutic activity, Neuromuscular re-education, Balance training, Gait training, Patient/Family education, Self Care, Joint mobilization, Joint manipulation, Stair training, Aquatic Therapy, Dry Needling, Electrical stimulation, Spinal manipulation, Spinal mobilization, Cryotherapy, Moist heat, Taping, Traction, and Ultrasound.  PLAN FOR NEXT SESSION: HEP review; lateral hip and core stability/strengthening  Durwin Reges DPT  Durwin Reges, PT 03/21/2022, 9:14 AM

## 2022-03-28 ENCOUNTER — Encounter: Payer: Self-pay | Admitting: Physical Therapy

## 2022-03-28 ENCOUNTER — Ambulatory Visit: Payer: Medicare Other | Admitting: Physical Therapy

## 2022-03-28 DIAGNOSIS — M5459 Other low back pain: Secondary | ICD-10-CM

## 2022-03-28 NOTE — Therapy (Signed)
OUTPATIENT PHYSICAL THERAPY THORACOLUMBAR PROGRESS NOTE Reporting Period 01/25/22 - 03/05/22   Patient Name: Scott Ochoa MRN: [id number] DOB:1945/12/09, 77 y.o., male Today's Date: 03/28/2022   PT End of Session - 03/28/22 0840     Visit Number 12    Number of Visits 17    Date for PT Re-Evaluation 03/23/22    Authorization - Visit Number 12    Authorization - Number of Visits 17    Progress Note Due on Visit 20    PT Start Time 0836    PT Stop Time 0914    PT Time Calculation (min) 38 min    Activity Tolerance Patient tolerated treatment well    Behavior During Therapy [facility] for tasks assessed/performed                        History reviewed. No pertinent past medical history. History reviewed. No pertinent surgical history. There are no problems to display for this patient.   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 Reports prior to this episode he had not had an episode of pain for 3 years, and that usually they only last a couple weeks. Insidious onset of pain. Pain is at the R side of the low back that radiates down posterior R leg with numbess and tingling to the toes. Reports pain is exacerbated by hitting golf balls, bending forward, walking >3miles, sitting/traveling >29mins, lifting something from the floor. Pt reports current  pain 4/10, worst: 6/10, best: 2/10. Pt is retired, is the landlord of a couple Manufacturing engineer, enjoys golfing and going to the gym 5-6x/week and is using strengthening machines with treadmill/elliptical for . Pt denies N/V, B&B changes, unexplained weight fluctuation, saddle paresthesia, fever, night sweats, or unrelenting night pain at this time.   PAIN:  Are you having pain? Yes: NPRS scale: 4/10 Pain location: R side of the low back radiation down RLE Pain description: sharp, n/t down RLE Aggravating  factors: hitting golf balls, bending forward, walking >35miles, sitting/traveling >62mins, lifting something from the floor Relieving factors: ice, chiropractic  PRECAUTIONS: None  WEIGHT BEARING RESTRICTIONS: No  FALLS:  Has patient fallen in last 6 months? No  LIVING ENVIRONMENT: Lives with: lives alone Lives in: House/apartment Stairs: No Has following equipment at home: None  OCCUPATION:    PLOF: Independent  PATIENT GOALS: Decrease pain to be able to play golf   NEXT MD VISIT:   OBJECTIVE:   DIAGNOSTIC FINDINGS:  Sept 2023 1. L2-3: Bulging of the disc. Facet and ligamentous hypertrophy.  Stenosis of both lateral recesses that could cause neural  compression. Findings have worsened slightly since 2011.  2. L3-4: Annular bulging with annular fissures. Facet and  ligamentous hypertrophy. Stenosis of both lateral recesses that  could cause neural compression. Findings have worsened since 2011.  3. L4-5: Annular bulging. Facet and ligamentous hypertrophy. Mild  stenosis of both lateral recesses, slightly worsened since 2011.   PATIENT SURVEYS:  FOTO goal of   SCREENING FOR RED FLAGS: Bowel or bladder incontinence: No Spinal tumors: No Cauda equina syndrome: No Compression fracture: No Abdominal aneurysm: No  COGNITION: Overall cognitive status: Within functional limits for tasks assessed     SENSATION: WFL  MUSCLE LENGTH: Hamstrings: shortened  bilat approx 110d Thomas test: WNL bilat  POSTURE: rounded shoulders, forward head, decreased lumbar lordosis, increased thoracic kyphosis, and weight shift left with R lateral trunk lean  PALPATION: Concordant hip pain to deep palpation of glute max over piriformis, reporting some radicular pain with this No TTP or pain to lumbar parspinal, QL or glute med/superior glute No change in pain with CPA/UPA mobilizations over lumbar segments, hypomobile throughout Hypomobility to mid thoracic spine as well  LUMBAR ROM:   AROM eval  Flexion 25% limited  Extension 25% limited w. pain  Right lateral flexion WNL with pain to side of the LB  Left lateral flexion wnl  Right rotation WNL  Left rotation WNL   (Blank rows = not tested)  LOWER EXTREMITY ROM:     Active  Right eval Left eval  Hip flexion WNL WNL  Hip extension 10% Limited bilat  Hip abduction WNL WNL  Hip adduction    Hip internal rotation 25% limited with hip pain bilat  Hip external rotation WNL WNL  Knee flexion WNL WNL  Knee extension WNL with tension/difficulty with true TKE  Ankle dorsiflexion WNL WNL  Ankle plantarflexion    Ankle inversion    Ankle eversion     (Blank rows = not tested)  LOWER EXTREMITY MMT:    MMT Right eval Left eval  Hip flexion 5 5  Hip extension 4 5  Hip abduction 4 5  Hip adduction    Hip internal rotation 5 5  Hip external rotation 5 5  Knee flexion    Knee extension    Ankle dorsiflexion    Ankle plantarflexion    Ankle inversion    Ankle eversion     (Blank rows = not tested)  LUMBAR SPECIAL TESTS:  Straight leg raise test: Negative, Slump test: Negative, Single leg stance test: R 20sec L ceased at 9min, FABER test: Negative, Trendelenburg sign: Positive, and Thomas test: Negative  Repeated motion neg for lumbar forward flex and ext   FUNCTIONAL TESTS:  5 times sit to stand: 8sec 10 meter walk test: 1.80m/s SLS R 5/20sec L ceased at 41min SL squat  unable  GAIT: Distance walked: 46m Assistive device utilized: None  Level of assistance: Complete Independence Comments: R trendelenberg with decreased R shoulder height/lateral trunk lean throughout ambilation  TODAY'S TREATMENT:                                                                                                                               Ther-Ex Nustep seat 10 UE 14 L3 for gentle lumbar mobility and low level strengthening cuing for SPM in 70s range  SL RDL with cone touch 2x 10 each with cuing for min use of balance from opposite UE and for full stand with decent carry over  Alt lateral step to SL squat 2x 12 with occassional UE touch for balance  Alt lateral jump with brief SL hold 2x 12  Squat on bosu ball (hardside) 2x 12 with min cuing for full stand with good carry over  Thoracolumbar rotation stretch 30sec bilat    PATIENT EDUCATION:  Education details: Patient was educated on diagnosis, anatomy and pathology involved, prognosis, role of PT, and was given an HEP, demonstrating exercise with proper form following verbal and tactile cues, and was given a paper hand out to continue exercise at home. Pt was educated on and agreed to plan of care.  Person educated: Patient Education method: Consulting civil engineer, Demonstration, Verbal cues, and Handouts Education comprehension: verbalized understanding, returned demonstration, verbal cues required, and tactile cues required  HOME EXERCISE PROGRAM: ENFRPNXT  ASSESSMENT:  CLINICAL IMPRESSION: PT progressed therex with increased balance focus through SL progression with patient able to comply with all cuing for proper technique of therex with good effort throughout session. Pt with clear difficulty with static balance. PT will plan on d/c next session to robust HEP should no further concerns arise.       OBJECTIVE IMPAIRMENTS: Abnormal gait, decreased activity tolerance, decreased balance, decreased coordination,  decreased endurance, decreased mobility, difficulty walking, decreased ROM, decreased strength, increased fascial restrictions, impaired perceived functional ability, impaired flexibility, impaired tone, improper body mechanics, postural dysfunction, and pain.   ACTIVITY LIMITATIONS: carrying, lifting, bending, sitting, standing, squatting, transfers, and reach over head  PARTICIPATION LIMITATIONS: meal prep, cleaning, laundry, driving, community activity, and occupation  PERSONAL FACTORS: Age, Past/current experiences, Sex, and Time since onset of injury/illness/exacerbation are also affecting patient's functional outcome.   REHAB POTENTIAL: Good  CLINICAL DECISION MAKING: Evolving/moderate complexity  EVALUATION COMPLEXITY: Moderate   GOALS: Goals reviewed with patient? Yes  SHORT TERM GOALS: Target date: 04/25/2022  Pt will be independent with HEP in order to improve strength and mobility in order to  improve function at home and in the community.  Baseline: 01/25/22 HEP given  Goal status: INITIAL   LONG TERM GOALS: Target date: 05/23/2022  Pt will decrease worst pain as reported on NPRS by at least 3 points in order to demonstrate clinically significant reduction in pain.  Baseline: 01/25/22 6/10; 03/05/22 3/10 Goal status: MET  2.  Patient will increase FOTO score to 79 to demonstrate predicted increase in functional  mobility to complete ADLs  Baseline: 72; 03/05/22 71 Goal status: ONGOING  3.  Patient will demonstrate 5/5 R hip MMTs to demonstrate PLOF strength of LLE in order to complete heavy ADLs Baseline: R hip ext and abd 4/5 MMT; 03/05/22 ext 4+/5 abd 5/5 Goal status: PARTIALLY MET  4.  Pt will demonstrate R SLS of 60sec or more in order to demonstrate unaffected side static balance norms Baseline: R: 20sec L ceased at a minute; R: 38sec L 60sec Goal status: REVISED   PLAN:  PT FREQUENCY: 1-2x/week  PT DURATION: 8 weeks  PLANNED INTERVENTIONS: Therapeutic  exercises, Therapeutic activity, Neuromuscular re-education, Balance training, Gait training, Patient/Family education, Self Care, Joint mobilization, Joint manipulation, Stair training, Aquatic Therapy, Dry Needling, Electrical stimulation, Spinal manipulation, Spinal mobilization, Cryotherapy, Moist heat, Taping, Traction, and Ultrasound.  PLAN FOR NEXT SESSION: HEP review; lateral hip and core stability/strengthening  Hilda Lias DPT  Hilda Lias, PT 03/28/2022, 9:17 AM

## 2022-04-05 ENCOUNTER — Ambulatory Visit: Payer: Medicare Other | Admitting: Physical Therapy

## 2022-04-11 ENCOUNTER — Encounter: Payer: Self-pay | Admitting: Physical Therapy

## 2022-04-11 ENCOUNTER — Ambulatory Visit: Payer: Medicare Other | Admitting: Physical Therapy

## 2022-04-11 DIAGNOSIS — M5459 Other low back pain: Secondary | ICD-10-CM | POA: Diagnosis not present

## 2022-04-11 NOTE — Therapy (Signed)
OUTPATIENT PHYSICAL THERAPY THORACOLUMBAR DC Summary Reporting Period 03/05/22 - 04/11/22    Patient Name: Scott Ochoa MRN: [id number] DOB:07/19/1945, 77 y.o., male Today's Date: 04/11/2022   PT End of Session - 04/11/22 1341     Visit Number 13    Number of Visits 17    Date for PT Re-Evaluation 03/23/22    Authorization - Visit Number 13    Authorization - Number of Visits 17    Progress Note Due on Visit 20    PT Start Time 1343    PT Stop Time 1425    PT Time Calculation (min) 42 min    Activity Tolerance Patient tolerated treatment well    Behavior During Therapy [facility] for tasks assessed/performed                         History reviewed. No pertinent past medical history. History reviewed. No pertinent surgical history. There are no problems to display for this patient.   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 Reports prior to this episode he had not had an episode of pain for 3 years, and that usually they only last a couple weeks. Insidious onset of pain. Pain is at the R side of the low back that radiates down posterior R leg with numbess and tingling to the toes. Reports pain is exacerbated by hitting golf balls, bending forward, walking >48miles, sitting/traveling >31mins, lifting something from the floor. Pt reports current  pain 4/10, worst: 6/10, best: 2/10. Pt is retired, is the landlord of a couple Manufacturing engineer, enjoys golfing and going to the gym 5-6x/week and is using strengthening machines with treadmill/elliptical for . Pt denies N/V, B&B changes, unexplained weight fluctuation, saddle paresthesia, fever, night sweats, or unrelenting night pain at this time.   PAIN:  Are you having pain? Yes: NPRS scale: 4/10 Pain location: R side of the low back radiation down RLE Pain description: sharp, n/t down  RLE Aggravating factors: hitting golf balls, bending forward, walking >9miles, sitting/traveling >11mins, lifting something from the floor Relieving factors: ice, chiropractic  PRECAUTIONS: None  WEIGHT BEARING RESTRICTIONS: No  FALLS:  Has patient fallen in last 6 months? No  LIVING ENVIRONMENT: Lives with: lives alone Lives in: House/apartment Stairs: No Has following equipment at home: None  OCCUPATION:    PLOF: Independent  PATIENT GOALS: Decrease pain to be able to play golf   NEXT MD VISIT:   OBJECTIVE:   DIAGNOSTIC FINDINGS:  Sept 2023 1. L2-3: Bulging of the disc. Facet and ligamentous hypertrophy.  Stenosis of both lateral recesses that could cause neural  compression. Findings have worsened slightly since 2011.  2. L3-4: Annular bulging with annular fissures. Facet and  ligamentous hypertrophy. Stenosis of both lateral recesses that  could cause neural compression. Findings have worsened since 2011.  3. L4-5: Annular bulging. Facet and ligamentous hypertrophy. Mild  stenosis of both lateral recesses, slightly worsened since 2011.   PATIENT SURVEYS:  FOTO goal of   SCREENING FOR RED FLAGS: Bowel or bladder incontinence: No Spinal tumors: No Cauda equina syndrome: No Compression fracture: No Abdominal aneurysm: No  COGNITION: Overall cognitive status: Within functional limits for tasks assessed     SENSATION: WFL  MUSCLE LENGTH: Hamstrings: shortened  bilat approx 110d Thomas test: WNL bilat  POSTURE: rounded shoulders, forward head, decreased lumbar lordosis, increased thoracic kyphosis, and weight shift left with R lateral trunk lean  PALPATION: Concordant hip pain to deep palpation of glute max over piriformis, reporting some radicular pain with this No TTP or pain to lumbar parspinal, QL or glute med/superior glute No change in pain with CPA/UPA mobilizations over lumbar segments, hypomobile throughout Hypomobility to mid thoracic spine as well  LUMBAR ROM:   AROM eval  Flexion 25% limited  Extension 25% limited w. pain  Right lateral flexion WNL with pain to side of the LB  Left lateral flexion wnl  Right rotation WNL  Left rotation WNL   (Blank rows = not tested)  LOWER EXTREMITY ROM:     Active  Right eval Left eval  Hip flexion WNL WNL  Hip extension 10% Limited bilat  Hip abduction WNL WNL  Hip adduction    Hip internal rotation 25% limited with hip pain bilat  Hip external rotation WNL WNL  Knee flexion WNL WNL  Knee extension WNL with tension/difficulty with true TKE  Ankle dorsiflexion WNL WNL  Ankle plantarflexion    Ankle inversion    Ankle eversion     (Blank rows = not tested)  LOWER EXTREMITY MMT:    MMT Right eval Left eval  Hip flexion 5 5  Hip extension 4 5  Hip abduction 4 5  Hip adduction    Hip internal rotation 5 5  Hip external rotation 5 5  Knee flexion    Knee extension    Ankle dorsiflexion    Ankle plantarflexion    Ankle inversion    Ankle eversion     (Blank rows = not tested)  LUMBAR SPECIAL TESTS:  Straight leg raise test: Negative, Slump test: Negative, Single leg stance test: R 20sec L ceased at 18min, FABER test: Negative, Trendelenburg sign: Positive, and Thomas test: Negative  Repeated motion neg for lumbar forward flex and ext   FUNCTIONAL TESTS:  5 times sit to stand: 8sec 10 meter walk test: 1.56m/s SLS R 5/20sec L ceased at 15min SL squat  unable  GAIT: Distance walked: 23m Assistive device  utilized: None Level of assistance: Complete Independence Comments: R trendelenberg with decreased R shoulder height/lateral trunk lean throughout ambilation  TODAY'S TREATMENT:                                                                                                                               Ther-Ex Nustep seat 10 UE 14 L3 for gentle lumbar mobility and low level strengthening cuing for SPM in 70s range   PT reviewed the following HEP with patient with patient able to demonstrate a set of the following with min cuing for correction needed. PT educated patient on parameters of therex (how/when to inc/decrease intensity, frequency, rep/set range, stretch hold time, and purpose of therex) with verbalized understanding.   Access Code: [email or web address] - Sumo Squat with Dumbbell  - 1 x daily - 1-2 x weekly - 3 sets - 8-12 reps - Deadlift With Dumbbells  - 1 x daily - 1-2 x weekly - 3 sets - 8-12 reps - Full Leg Press  - 1 x daily - 1-2 x weekly - 3 sets - 8-12 reps - Standing Cable Hip Extension  - 1 x daily - 1-2 x weekly - 3 sets - 8-12 reps - Standing Cable Hip Abduction  - 1 x daily - 1-2 x weekly - 3 sets - 8-12 reps  R SLS 47sec; 61sec   Education on exercise regimen and guidelines for strength training and aerobic activity   PATIENT EDUCATION:  Education details: Patient was educated on diagnosis, anatomy and pathology involved, prognosis, role of PT, and was given an HEP, demonstrating exercise with proper form following verbal and tactile cues, and was given a paper hand out to continue exercise at home. Pt was educated on and agreed to plan of care.  Person educated: Patient Education method: Explanation, Demonstration, Verbal cues, and Handouts Education comprehension: verbalized understanding, returned demonstration, verbal cues required, and tactile cues required  HOME EXERCISE  PROGRAM: [email or web address]  ASSESSMENT:  CLINICAL IMPRESSION: PT reviewed the following HEP with patient with patient able to demonstrate a set of the following with min cuing for correction needed. PT educated patient on parameters of therex (how/when to inc/decrease intensity, frequency, rep/set range, stretch hold time, and purpose of therex) with verbalized understanding.       OBJECTIVE IMPAIRMENTS: Abnormal gait, decreased activity tolerance, decreased balance, decreased coordination, decreased endurance, decreased mobility, difficulty walking, decreased ROM, decreased strength, increased fascial restrictions, impaired perceived functional ability, impaired flexibility, impaired tone, improper body mechanics, postural dysfunction, and pain.   ACTIVITY LIMITATIONS: carrying, lifting, bending, sitting, standing, squatting, transfers, and reach over head  PARTICIPATION LIMITATIONS: meal prep, cleaning, laundry, driving, community activity, and occupation  PERSONAL FACTORS: Age, Past/current experiences, Sex, and Time since onset of injury/illness/exacerbation are also affecting patient's functional outcome.   REHAB POTENTIAL: Good  CLINICAL DECISION MAKING: Evolving/moderate complexity  EVALUATION COMPLEXITY: Moderate   GOALS: Goals reviewed with patient? Yes  SHORT TERM GOALS: Target date:  05/09/2022  Pt will be independent with HEP in order to improve strength and mobility in order to  improve function at home and in the community.  Baseline: 01/25/22 HEP given  Goal status: INITIAL   LONG TERM GOALS: Target date: 06/06/2022  Pt will decrease worst pain as reported on NPRS by at least 3 points in order to demonstrate clinically significant reduction in pain.  Baseline: 01/25/22 6/10; 03/05/22 3/10 Goal status: MET  2.  Patient will increase FOTO score to 79 to demonstrate predicted increase in functional mobility to complete ADLs  Baseline: 72; 03/05/22 71; 81 Goal status:  MET  3.  Patient will demonstrate 5/5 R hip MMTs to demonstrate PLOF strength of LLE in order to complete heavy ADLs Baseline: R hip ext and abd 4/5 MMT; 03/05/22 ext 4+/5 abd 5/5 Goal status: PARTIALLY MET  4.  Pt will demonstrate R SLS of 60sec or more in order to demonstrate unaffected side static balance norms Baseline: R: 20sec L ceased at a minute; R: 38sec L 60sec; 04/11/22 RLE: 62sec Goal status: MET   PLAN:  PT FREQUENCY: 1-2x/week  PT DURATION: 8 weeks  PLANNED INTERVENTIONS: Therapeutic exercises, Therapeutic activity, Neuromuscular re-education, Balance training, Gait training, Patient/Family education, Self Care, Joint mobilization, Joint manipulation, Stair training, Aquatic Therapy, Dry Needling, Electrical stimulation, Spinal manipulation, Spinal mobilization, Cryotherapy, Moist heat, Taping, Traction, and Ultrasound.  PLAN FOR NEXT SESSION: HEP review; lateral hip and core stability/strengthening  Durwin Reges DPT  Durwin Reges, PT 04/11/2022, 2:26 PM

## 2023-02-01 ENCOUNTER — Encounter: Payer: Self-pay | Admitting: Urology

## 2023-02-01 ENCOUNTER — Ambulatory Visit: Payer: Medicare Other | Admitting: Urology

## 2023-02-01 VITALS — BP 127/72 | HR 78 | Ht 71.0 in | Wt 185.0 lb

## 2023-02-01 DIAGNOSIS — R972 Elevated prostate specific antigen [PSA]: Secondary | ICD-10-CM | POA: Diagnosis not present

## 2023-02-01 DIAGNOSIS — N401 Enlarged prostate with lower urinary tract symptoms: Secondary | ICD-10-CM

## 2023-02-01 NOTE — Progress Notes (Signed)
    I, Maysun Anabel Bene, acting as a scribe for Riki Altes, MD., have documented all relevant documentation on the behalf of Riki Altes, MD, as directed by Riki Altes, MD while in the presence of Riki Altes, MD.  02/01/2023 11:48 AM   Scott Ochoa December 12, 1945 [id number]  Referring provider: Gracelyn Nurse, MD [address] [facility] [place],  [place] [address]  Chief Complaint  Patient presents with   Follow-up   Elevated PSA   Urologic history: 1. Elevated PSA [facility] with [facility]. Most recent PSAs has been stable in the 5 range.  2. BPH with LUTS Tamsulosin  3. Erectile dysfunction Sildenafil PRN.  HPI: Scott Ochoa is a 77 y.o. male presents for annual follow-up.  Since last year's visit, he experienced worsening ED on daily tamsulosin after taking it 3-4 months and is currently taking it every 3 days, which resolved those side effects and still helps his lower urinary tract symptoms.  He was initially referred for a PSA at 6.94 and follow-up PSA 1 month later was back to baseline at 5.5. PSA with his PCP May 2024 was stable at 5.5 He states he is scheduled for a PSA with his primary care provider next week.   Home Medications:  Allergies as of 02/01/2023       Reactions   Prednisone Other (See Comments)   Difficulty sleeping   Rosuvastatin Other (See Comments)   Other reaction(s): Unknown   Sulfa Antibiotics    Other reaction(s): Unknown  Other reaction(s): Unknown        Medication List        Accurate as of February 01, 2023 11:48 AM. If you have any questions, ask your nurse or doctor.          tamsulosin 0.4 MG Caps capsule Commonly known as: FLOMAX TAKE 1 CAPSULE BY MOUTH EVERY DAY   traZODone 50 MG tablet Commonly known as: DESYREL TAKE ONE AND ONE-HALF TABLET BY MOUTH AT BEDTIME AS NEEDED FOR SLEEP        Allergies:  Allergies  Allergen Reactions   Prednisone Other  (See Comments)    Difficulty sleeping   Rosuvastatin Other (See Comments)    Other reaction(s): Unknown   Sulfa Antibiotics     Other reaction(s): Unknown  Other reaction(s): Unknown    Social History:  reports that he has never smoked. He has never used smokeless tobacco. No history on file for alcohol use and drug use.   Physical Exam: BP 127/72   Pulse 78   Ht 5\' 11"  (1.803 m)   Wt 185 lb (83.9 kg)   BMI 25.80 kg/m   Constitutional:  Alert and oriented, No acute distress. HEENT: [place] AT Respiratory: Normal respiratory effort, no increased work of breathing. Psychiatric: Normal mood and affect.   Assessment & Plan:    1. Elevated PSA Stable PSA and he is scheduled to have a PSA check next week w/ PCP As long as PSA remains stable, will continue to follow.  Follow up office visit 1 year  2. BPH with LUTS Stable on Tamsulosin.   I have reviewed the above documentation for accuracy and completeness, and I agree with the above.   Riki Altes, MD  [facility] [address] [place], [place] [address] [phone]

## 2023-02-18 ENCOUNTER — Telehealth: Payer: Self-pay

## 2023-02-18 NOTE — Telephone Encounter (Signed)
His PSA has fluctuated-initially referred for PSA of 6.94.  Recommend lab visit for a repeat PSA in 3 months as this may be a transient elevation

## 2023-02-18 NOTE — Telephone Encounter (Signed)
Patient left message asking to have his PSA level reviewed and to let him know what Dr Lonna Cobb would like to do. PSA level was done with PCP and its in care everywhere Component Ref Range & Units 2 wk ago  PSA (Prostate Specific Antigen), Total 0.10 - 4.00 ng/mL 6.59 High

## 2023-02-19 ENCOUNTER — Other Ambulatory Visit: Payer: Self-pay

## 2023-02-19 DIAGNOSIS — R972 Elevated prostate specific antigen [PSA]: Secondary | ICD-10-CM

## 2023-02-19 DIAGNOSIS — N401 Enlarged prostate with lower urinary tract symptoms: Secondary | ICD-10-CM

## 2023-02-19 NOTE — Telephone Encounter (Signed)
Patient advised and order palced

## 2023-02-20 ENCOUNTER — Other Ambulatory Visit: Payer: Self-pay | Admitting: Nephrology

## 2023-02-20 DIAGNOSIS — R809 Proteinuria, unspecified: Secondary | ICD-10-CM

## 2023-02-20 DIAGNOSIS — N281 Cyst of kidney, acquired: Secondary | ICD-10-CM

## 2023-02-20 DIAGNOSIS — N1831 Chronic kidney disease, stage 3a: Secondary | ICD-10-CM

## 2023-03-01 ENCOUNTER — Ambulatory Visit
Admission: RE | Admit: 2023-03-01 | Discharge: 2023-03-01 | Disposition: A | Discharge status: Home / Self Care | Payer: Medicare Other | Source: Ambulatory Visit | Attending: Nephrology

## 2023-03-01 DIAGNOSIS — N1831 Chronic kidney disease, stage 3a: Secondary | ICD-10-CM | POA: Insufficient documentation

## 2023-03-01 DIAGNOSIS — R809 Proteinuria, unspecified: Secondary | ICD-10-CM | POA: Diagnosis present

## 2023-03-01 DIAGNOSIS — N281 Cyst of kidney, acquired: Secondary | ICD-10-CM | POA: Insufficient documentation

## 2023-03-04 LAB — COLOGUARD: COLOGUARD: NEGATIVE

## 2023-03-04 LAB — EXTERNAL GENERIC LAB PROCEDURE: COLOGUARD: NEGATIVE

## 2023-05-20 ENCOUNTER — Other Ambulatory Visit: Payer: Medicare Other

## 2023-05-20 DIAGNOSIS — R972 Elevated prostate specific antigen [PSA]: Secondary | ICD-10-CM

## 2023-05-20 DIAGNOSIS — N401 Enlarged prostate with lower urinary tract symptoms: Secondary | ICD-10-CM

## 2023-05-21 ENCOUNTER — Encounter: Payer: Self-pay | Admitting: Urology

## 2023-05-21 LAB — PSA: Prostate Specific Ag, Serum: 5.5 ng/mL — ABNORMAL HIGH (ref 0.0–4.0)

## 2023-08-09 ENCOUNTER — Telehealth: Payer: Self-pay

## 2023-08-09 ENCOUNTER — Telehealth: Payer: Self-pay | Admitting: Urology

## 2023-08-09 NOTE — Telephone Encounter (Signed)
 Pt called the triage line to make an appointment with Dr. Cherylene Corrente to discuss elevated PSA, he had it drawn at his PCP on 08/02/2023  LVM for pt to return call to schedule this appt.

## 2023-08-09 NOTE — Telephone Encounter (Signed)
 Patient called regarding elevated PSA lab results he received from [facility]. He had lab work done on 08/02/23. He would like Dr. Cherylene Corrente to review and let him know if he needs to schedule an appointment with our office, or what Dr. Cherylene Corrente recommends. Please advise patient

## 2023-08-11 NOTE — Telephone Encounter (Signed)
 PSA has increased to 8.64.  Recommend further evaluation with prostate MRI.  Please enter order and will contact patient with results

## 2023-08-13 ENCOUNTER — Other Ambulatory Visit: Payer: Self-pay | Admitting: *Deleted

## 2023-08-13 DIAGNOSIS — R972 Elevated prostate specific antigen [PSA]: Secondary | ICD-10-CM

## 2023-08-13 NOTE — Telephone Encounter (Signed)
 Pt LMOM that he called to schedule MRI and orders weren't in.

## 2023-08-13 NOTE — Telephone Encounter (Signed)
 Notified patient as instructed,   . Please contact Central Scheduling to set up your prostate MRI at [phone].  Prostate MRI Prep:  1- No ejaculation 48 hours prior to exam  2- No caffeine or carbonated beverages on day of the exam  3- Eat light diet evening prior and day of exam  4- Avoid eating 4 hours prior to exam  5- Fleets enema needs to be done 4 hours prior to exam -See below. Can be purchased at the drug store.

## 2023-08-22 ENCOUNTER — Ambulatory Visit
Admission: RE | Admit: 2023-08-22 | Discharge: 2023-08-22 | Disposition: A | Discharge status: Home / Self Care | Source: Ambulatory Visit | Attending: Urology | Admitting: Urology

## 2023-08-22 DIAGNOSIS — R972 Elevated prostate specific antigen [PSA]: Secondary | ICD-10-CM | POA: Insufficient documentation

## 2023-08-22 MED ORDER — GADOBUTROL 1 MMOL/ML IV SOLN
7.5000 mL | Freq: Once | INTRAVENOUS | Status: AC | PRN
  Administered 2023-08-22: 7.5 mL via INTRAVENOUS

## 2023-08-29 ENCOUNTER — Ambulatory Visit: Payer: Self-pay | Admitting: Urology

## 2023-08-29 DIAGNOSIS — R972 Elevated prostate specific antigen [PSA]: Secondary | ICD-10-CM

## 2023-10-01 ENCOUNTER — Encounter: Payer: Self-pay | Admitting: *Deleted

## 2023-10-17 ENCOUNTER — Ambulatory Visit: Admitting: Urology

## 2023-10-17 ENCOUNTER — Encounter: Payer: Self-pay | Admitting: Urology

## 2023-10-17 VITALS — BP 128/73 | HR 78

## 2023-10-17 DIAGNOSIS — R972 Elevated prostate specific antigen [PSA]: Secondary | ICD-10-CM | POA: Diagnosis not present

## 2023-10-17 DIAGNOSIS — N401 Enlarged prostate with lower urinary tract symptoms: Secondary | ICD-10-CM

## 2023-10-17 NOTE — Progress Notes (Signed)
    10/17/2023 10:06 AM   Scott Ochoa Aug 13, 1945 [id number]  Referring provider: Rudolpho Norleen BIRCH, MD [address] [facility] [place],  [place] [address]  Chief Complaint  Patient presents with   Follow-up   Urologic history: 1. Elevated PSA [facility] with [facility]. 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 BPH with LUTS Tamsulosin   3. Erectile dysfunction Sildenafil PRN.  HPI: Scott Ochoa is a 78 y.o. male presents for prostate biopsy follow-up.  No postbiopsy complaints TRUS volume 108 cc ROI cores x 2 of PI-RADS 4 and PI-RADS 3 lesions +12 core template biopsy 16/16 cores negative for cancer   Home Medications:  Allergies as of 10/17/2023       Reactions   Gramineae Pollens    Prednisone Other (See Comments)   Difficulty sleeping   Rosuvastatin Other (See Comments)   Other reaction(s): Unknown   Sulfa Antibiotics    Other reaction(s): Unknown  Other reaction(s): Unknown        Medication List        Accurate as of October 17, 2023 10:06 AM. If you have any questions, ask your nurse or doctor.          tamsulosin  0.4 MG Caps capsule Commonly known as: FLOMAX  TAKE 1 CAPSULE BY MOUTH EVERY DAY   traZODone 50 MG tablet Commonly known as: DESYREL TAKE ONE AND ONE-HALF TABLET BY MOUTH AT BEDTIME AS NEEDED FOR SLEEP        Allergies:  Allergies  Allergen Reactions   Gramineae Pollens    Prednisone Other (See Comments)    Difficulty sleeping   Rosuvastatin Other (See Comments)    Other reaction(s): Unknown   Sulfa Antibiotics     Other reaction(s): Unknown  Other reaction(s): Unknown    Social History:  reports that he has never smoked. He has never used smokeless tobacco.  No history on file for alcohol use and drug use.   Physical Exam: BP 128/73   Pulse 78   Constitutional:  Alert and oriented, No acute distress. HEENT: [place] AT Respiratory: Normal respiratory effort, no increased work of breathing. Psychiatric: Normal mood and affect.   Assessment & Plan:    1. Elevated PSA Benign prostate biopsy Significant prostate volume and inflammatory change  2. BPH with LUTS Stable on Tamsulosin . Based on prostate volume discussed adding a 5-ARI medication however he does not desire to start at this time Follow-up appointment is scheduled November 2025   Scott JAYSON Barba, MD  [facility] [address] [place], [place] [address] [phone]

## 2023-11-14 ENCOUNTER — Other Ambulatory Visit: Payer: Self-pay | Admitting: Urology

## 2023-11-14 ENCOUNTER — Encounter: Payer: Self-pay | Admitting: Urology

## 2023-11-14 MED ORDER — TADALAFIL 5 MG PO TABS: ORAL_TABLET | ORAL | 1 refills | Status: DC

## 2023-11-15 NOTE — Telephone Encounter (Signed)
 Pa information has been sent to [employer].Via cover my meds.

## 2024-01-27 ENCOUNTER — Other Ambulatory Visit: Payer: Self-pay | Admitting: *Deleted

## 2024-01-27 ENCOUNTER — Other Ambulatory Visit

## 2024-01-27 DIAGNOSIS — R972 Elevated prostate specific antigen [PSA]: Secondary | ICD-10-CM

## 2024-01-28 LAB — PSA: Prostate Specific Ag, Serum: 6.6 ng/mL — ABNORMAL HIGH (ref 0.0–4.0)

## 2024-01-31 ENCOUNTER — Encounter: Payer: Self-pay | Admitting: Urology

## 2024-01-31 ENCOUNTER — Ambulatory Visit: Payer: Self-pay | Admitting: Urology

## 2024-01-31 VITALS — BP 137/77 | HR 71 | Ht 71.0 in | Wt 185.0 lb

## 2024-01-31 DIAGNOSIS — N401 Enlarged prostate with lower urinary tract symptoms: Secondary | ICD-10-CM | POA: Diagnosis not present

## 2024-01-31 DIAGNOSIS — N5201 Erectile dysfunction due to arterial insufficiency: Secondary | ICD-10-CM

## 2024-01-31 DIAGNOSIS — R972 Elevated prostate specific antigen [PSA]: Secondary | ICD-10-CM | POA: Diagnosis not present

## 2024-01-31 NOTE — Progress Notes (Signed)
 01/31/2024 8:04 AM   Scott Ochoa Oct 01, 1945 [id number]  Referring provider: Rudolpho Norleen BIRCH, MD [address] [facility] [place],  [place] [address]  Chief Complaint  Patient presents with   Elevated PSA   Urologic history:  1. Elevated PSA [facility] with [facility]. Prostate MRI 08/22/2023; PSA bump 8.64; 93 cc prostate volume; PI-RADS 4 lesion right anterior PZ, PI-RADS 3 lesion left anterior TZ MR fusion biopsy in [facility] 10/01/2023: ROI cores negative for cancer, chronic prostatitis; 12/12 template cores negative for cancer; TRUS volume 108 cc  2. BPH with LUTS Tamsulosin    3. Erectile dysfunction Sildenafil PRN.   HPI: Scott Ochoa is a 78 y.o. male presents for follow-up visit  No complaints since last visit Stable lower urinary tract symptoms Denies dysuria, gross hematuria No flank, abdominal or pelvic pain PSA 01/27/2024 was 6.6  PSA trend    Prostate Specific Ag, Serum  Latest Ref Rng 0.0 - 4.0 ng/mL  07/27/2015 5.20  06/20/2019 4.27  07/19/2020 5.31  07/20/2021 5.39  01/23/2022 6.94  12.18/2023 5.5  08/01/2022 5.91  02/01/2023 6.59  05/20/2023 5.5  08/02/2023 8.64  01/27/2024 6.6    PMH: Arthritis 2019  Chronic kidney disease November 2022  GERD (gastroesophageal reflux disease)  Hiatal hernia  History of cataract 2019  History of neutropenia  History of proteinuria syndrome  Hyperlipidemia  Sleep apnea  Spinal stenosis   Surgical History: CHOLECYSTECTOMY 03/19/1992  ARTHROSCOPIC ROTATOR CUFF REPAIR 03/19/2002   Home Medications:  Allergies as of 01/31/2024       Reactions   Gramineae Pollens    Prednisone Other (See Comments)   Difficulty sleeping   Rosuvastatin Other (See Comments)   Other reaction(s): Unknown   Sulfa Antibiotics    Other reaction(s): Unknown  Other reaction(s): Unknown        Medication List        Accurate as of January 31, 2024  8:04 AM. If you have any questions,  ask your nurse or doctor.          tadalafil  5 MG tablet Commonly known as: CIALIS  1 tab daily   tamsulosin  0.4 MG Caps capsule Commonly known as: FLOMAX  TAKE 1 CAPSULE BY MOUTH EVERY DAY   traZODone 50 MG tablet Commonly known as: DESYREL TAKE ONE AND ONE-HALF TABLET BY MOUTH AT BEDTIME AS NEEDED FOR SLEEP        Allergies:  Allergies  Allergen Reactions   Gramineae Pollens    Prednisone Other (See Comments)    Difficulty sleeping   Rosuvastatin Other (See Comments)    Other reaction(s): Unknown   Sulfa Antibiotics     Other reaction(s): Unknown  Other reaction(s): Unknown    Family History: History reviewed. No pertinent family history.  Social History:  reports that he has never smoked. He has never used smokeless tobacco. No history on file for alcohol use and drug use.   Physical Exam: BP 137/77   Pulse 71   Ht 5' 11 (1.803 m)   Wt 185 lb (83.9 kg)   BMI 25.80 kg/m   Constitutional:  Alert, No acute distress. HEENT: [place] AT Respiratory: Normal respiratory effort, no increased work of breathing. Psychiatric: Normal mood and affect.   Assessment & Plan:    1.  Elevated PSA Stable Lab visit 6 months PSA Office visit 1 year  2.  BPH with LUTS Stable on tamsulosin   3.  Erectile dysfunction Stable   Jerek Meulemans C Glennda Weatherholtz,  MD  [facility] [address] [place], [place] [address] [phone]

## 2024-02-21 ENCOUNTER — Other Ambulatory Visit: Payer: Self-pay | Admitting: Urology

## 2024-04-23 ENCOUNTER — Other Ambulatory Visit: Payer: Self-pay | Admitting: Urology

## 2024-05-01 ENCOUNTER — Encounter: Admission: RE | Payer: Self-pay | Source: Home / Self Care

## 2024-05-01 ENCOUNTER — Ambulatory Visit: Admission: RE | Admit: 2024-05-01 | Source: Home / Self Care

## 2024-07-30 ENCOUNTER — Other Ambulatory Visit

## 2025-01-29 ENCOUNTER — Other Ambulatory Visit

## 2025-02-02 ENCOUNTER — Ambulatory Visit: Admitting: Urology
